# Patient Record
Sex: Male | Born: 1955 | Race: Black or African American | Hispanic: No | Marital: Single | Smoking: Current some day smoker
Health system: Southern US, Community
[De-identification: ages and names within clinical notes are randomized; demographics above are authoritative.]

## PROBLEM LIST (undated history)

## (undated) DIAGNOSIS — E785 Hyperlipidemia, unspecified: Secondary | ICD-10-CM

## (undated) DIAGNOSIS — Z720 Tobacco use: Secondary | ICD-10-CM

## (undated) DIAGNOSIS — S52502A Unspecified fracture of the lower end of left radius, initial encounter for closed fracture: Secondary | ICD-10-CM

## (undated) DIAGNOSIS — S069XAA Unspecified intracranial injury with loss of consciousness status unknown, initial encounter: Secondary | ICD-10-CM

## (undated) DIAGNOSIS — S069X9A Unspecified intracranial injury with loss of consciousness of unspecified duration, initial encounter: Secondary | ICD-10-CM

## (undated) DIAGNOSIS — R7303 Prediabetes: Secondary | ICD-10-CM

## (undated) DIAGNOSIS — I10 Essential (primary) hypertension: Secondary | ICD-10-CM

## (undated) HISTORY — DX: Prediabetes: R73.03

## (undated) HISTORY — DX: Tobacco use: Z72.0

## (undated) HISTORY — DX: Hyperlipidemia, unspecified: E78.5

## (undated) HISTORY — DX: Unspecified intracranial injury with loss of consciousness of unspecified duration, initial encounter: S06.9X9A

## (undated) HISTORY — DX: Unspecified intracranial injury with loss of consciousness status unknown, initial encounter: S06.9XAA

## (undated) HISTORY — PX: NO PAST SURGERIES: SHX2092

---

## 2000-12-24 ENCOUNTER — Encounter: Payer: Self-pay | Admitting: *Deleted

## 2000-12-24 ENCOUNTER — Emergency Department (HOSPITAL_COMMUNITY): Admission: EM | Admit: 2000-12-24 | Discharge: 2000-12-24 | Payer: Self-pay | Admitting: *Deleted

## 2004-04-01 ENCOUNTER — Emergency Department (HOSPITAL_COMMUNITY): Admission: EM | Admit: 2004-04-01 | Discharge: 2004-04-02 | Payer: Self-pay | Admitting: Emergency Medicine

## 2004-05-06 ENCOUNTER — Emergency Department (HOSPITAL_COMMUNITY): Admission: EM | Admit: 2004-05-06 | Discharge: 2004-05-06 | Payer: Self-pay | Admitting: Emergency Medicine

## 2004-05-08 ENCOUNTER — Encounter: Payer: Self-pay | Admitting: Orthopedic Surgery

## 2004-12-24 IMAGING — CR DG KNEE COMPLETE 4+V*L*
4 series · 4 of 4 positions shown · non-contrast
Comparison: none

CLINICAL DATA: Pain.  Twist injury.
 LEFT KNEE FOUR VIEWS
 Non union of anterior tubercle ossification center.  Probable small joint effusion.  No acute fracture, dislocation or bone destruction.  Minimal degenerative changes.
 IMPRESSION
 Joint effusion.  No acute bony abnormalities.

[view not recorded (1 of 4)]
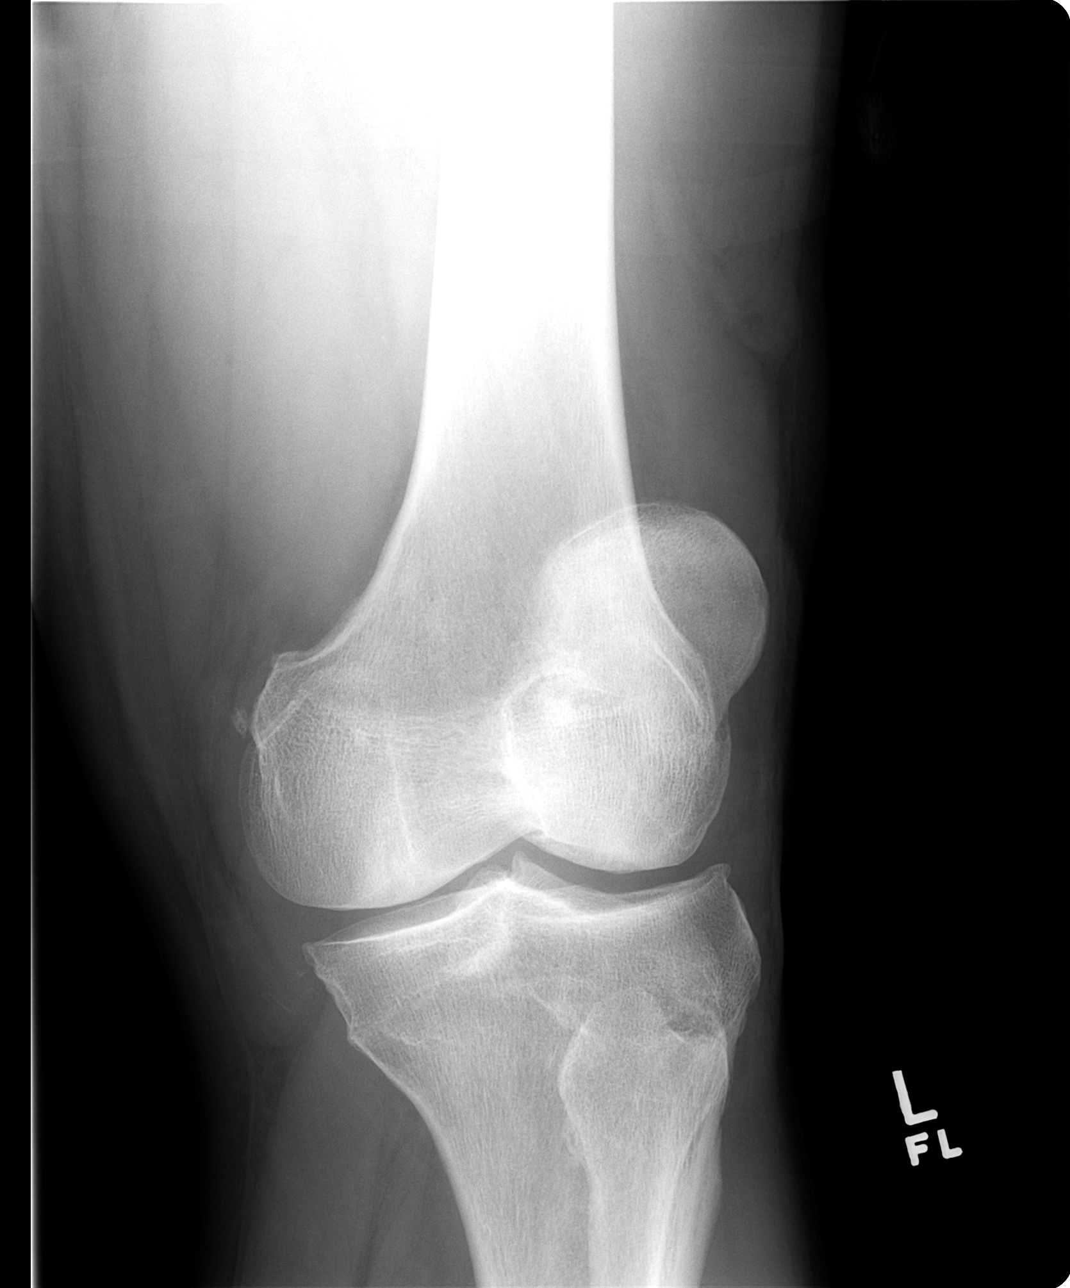

[view not recorded (2 of 4)]
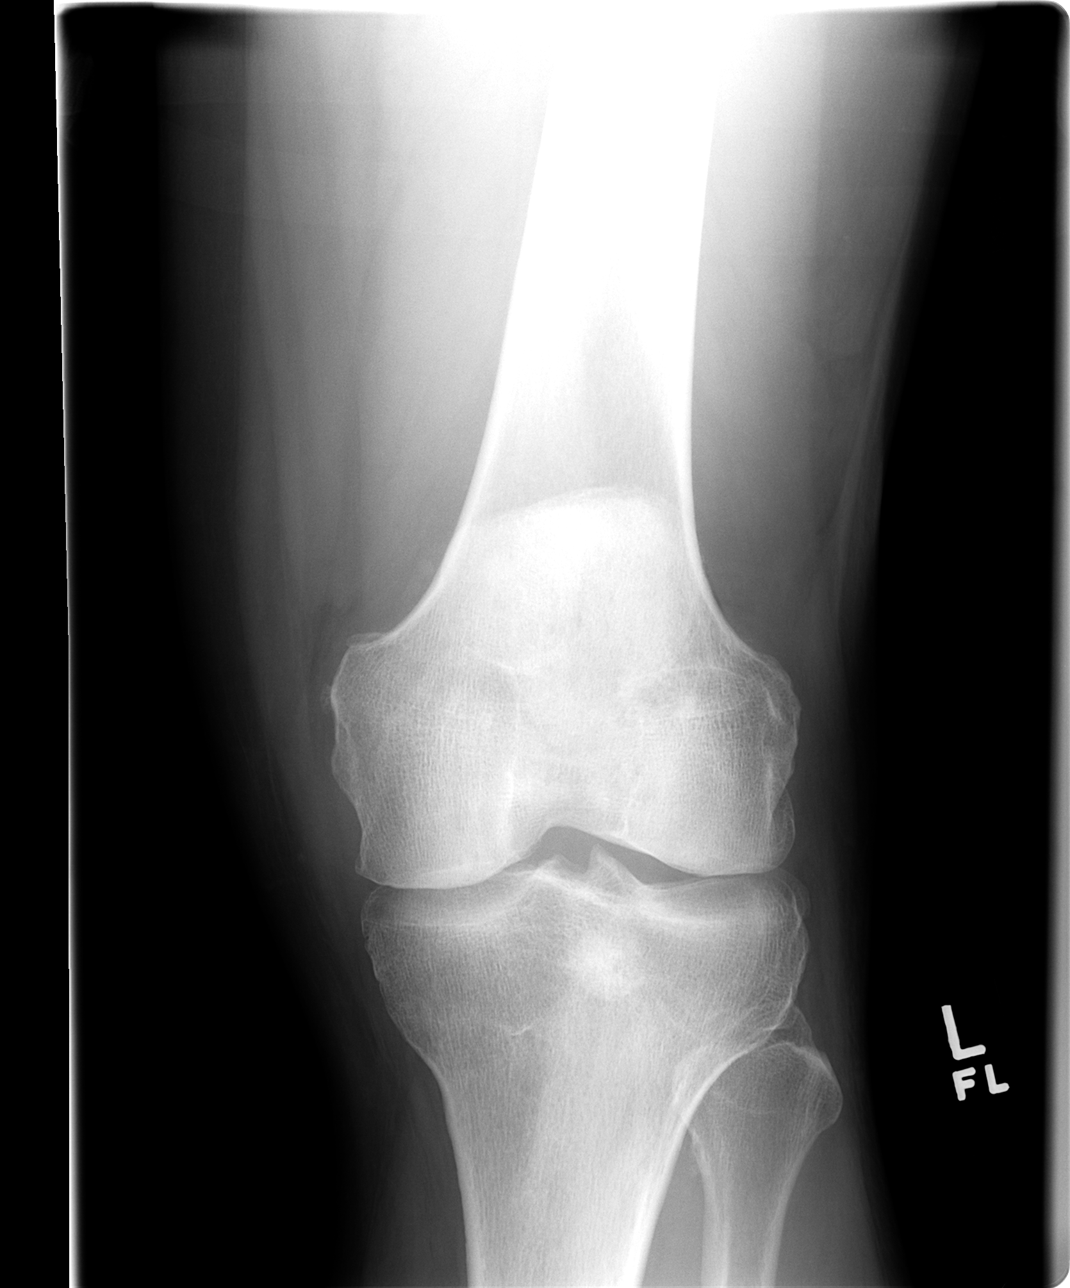

[view not recorded (3 of 4)]
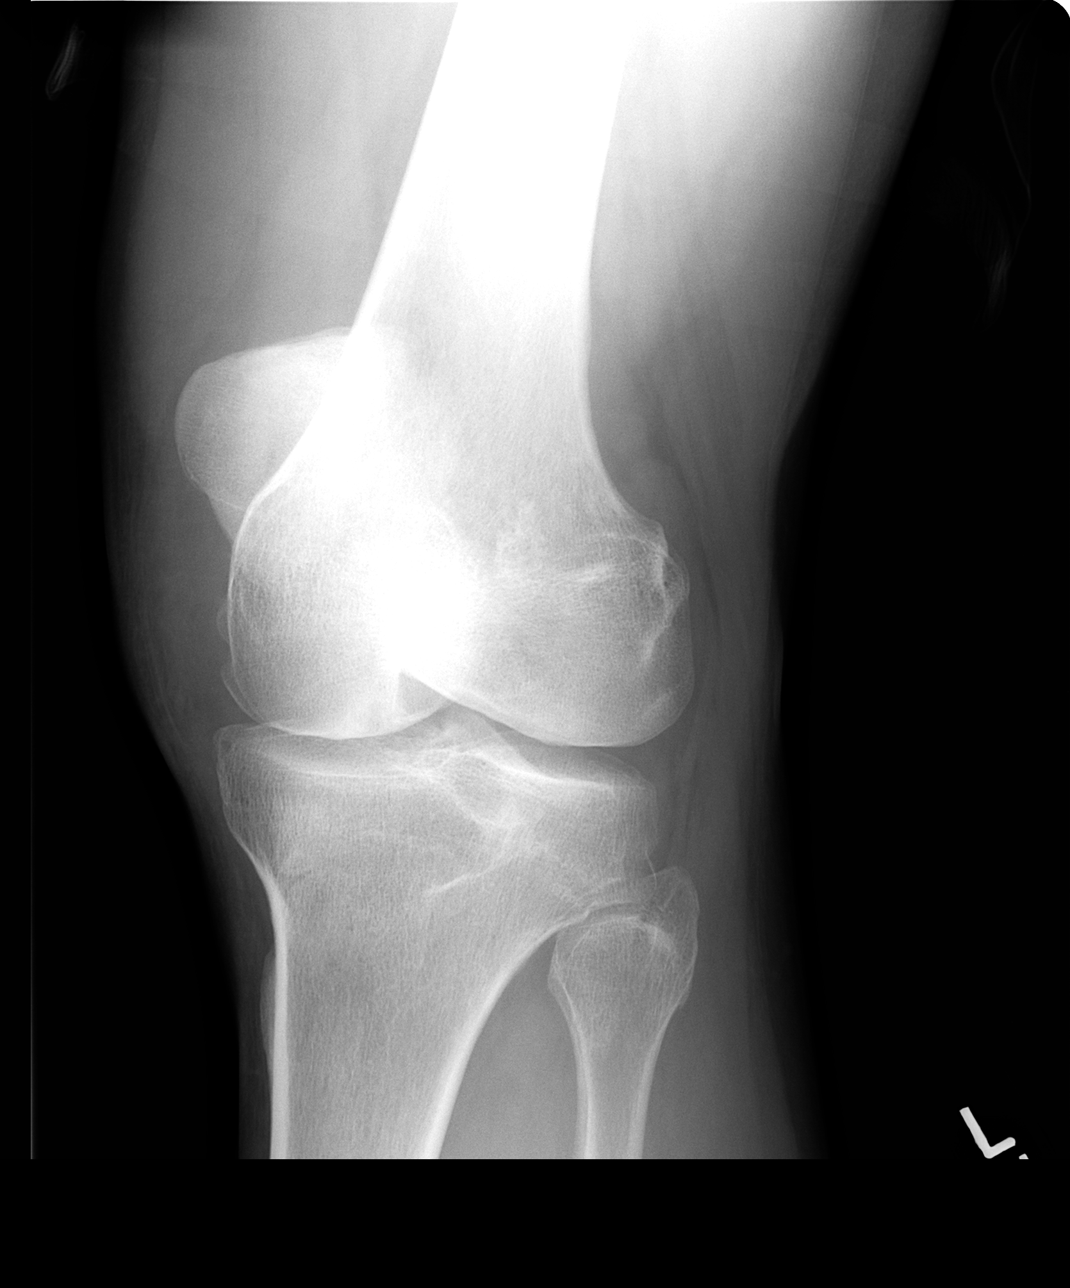

[view not recorded (4 of 4)]
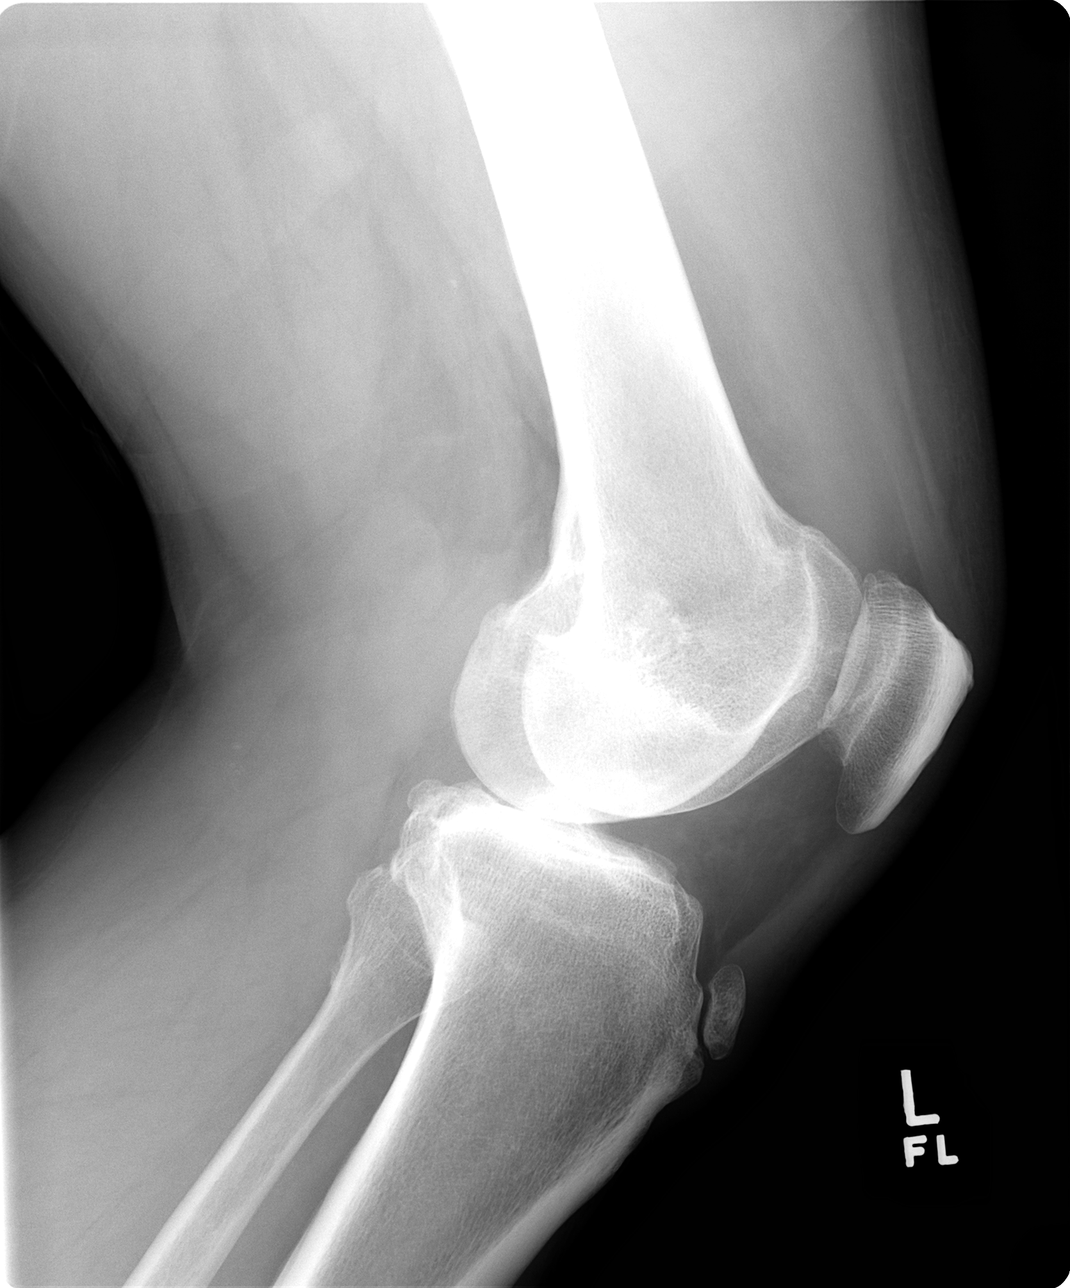

[4 of 4 positions shown; findings below may reference images not displayed]

## 2007-06-08 ENCOUNTER — Emergency Department (HOSPITAL_COMMUNITY): Admission: EM | Admit: 2007-06-08 | Discharge: 2007-06-09 | Payer: Self-pay | Admitting: Emergency Medicine

## 2008-05-18 ENCOUNTER — Encounter: Payer: Self-pay | Admitting: Orthopedic Surgery

## 2008-05-18 ENCOUNTER — Emergency Department (HOSPITAL_COMMUNITY): Admission: EM | Admit: 2008-05-18 | Discharge: 2008-05-18 | Payer: Self-pay | Admitting: Emergency Medicine

## 2008-06-02 ENCOUNTER — Ambulatory Visit: Payer: Self-pay | Admitting: Orthopedic Surgery

## 2008-06-02 DIAGNOSIS — M1710 Unilateral primary osteoarthritis, unspecified knee: Secondary | ICD-10-CM

## 2008-06-02 DIAGNOSIS — IMO0002 Reserved for concepts with insufficient information to code with codable children: Secondary | ICD-10-CM | POA: Insufficient documentation

## 2008-06-02 DIAGNOSIS — M25469 Effusion, unspecified knee: Secondary | ICD-10-CM

## 2013-06-11 ENCOUNTER — Encounter (HOSPITAL_COMMUNITY): Payer: Self-pay | Admitting: Emergency Medicine

## 2013-06-11 DIAGNOSIS — J020 Streptococcal pharyngitis: Secondary | ICD-10-CM | POA: Insufficient documentation

## 2013-06-11 DIAGNOSIS — F17200 Nicotine dependence, unspecified, uncomplicated: Secondary | ICD-10-CM | POA: Insufficient documentation

## 2013-06-11 DIAGNOSIS — R599 Enlarged lymph nodes, unspecified: Secondary | ICD-10-CM | POA: Insufficient documentation

## 2013-06-11 NOTE — ED Notes (Signed)
Patient c/o sore throat and possible sinus infection x2 days.  Patient states has been taking OTC medication without relief.

## 2013-06-12 ENCOUNTER — Emergency Department (HOSPITAL_COMMUNITY)
Admission: EM | Admit: 2013-06-12 | Discharge: 2013-06-12 | Disposition: A | Discharge status: Home / Self Care | Payer: Self-pay | Attending: Emergency Medicine | Admitting: Emergency Medicine

## 2013-06-12 DIAGNOSIS — J020 Streptococcal pharyngitis: Secondary | ICD-10-CM

## 2013-06-12 LAB — RAPID STREP SCREEN (MED CTR MEBANE ONLY): Streptococcus, Group A Screen (Direct): POSITIVE — AB

## 2013-06-12 MED ORDER — PENICILLIN G BENZATHINE 1200000 UNIT/2ML IM SUSP
1.2000 10*6.[IU] | Freq: Once | INTRAMUSCULAR | Status: AC
  Administered 2013-06-12: 1.2 10*6.[IU] via INTRAMUSCULAR
  Filled 2013-06-12: qty 2

## 2013-06-12 MED ORDER — HYDROCODONE-ACETAMINOPHEN 7.5-325 MG/15ML PO SOLN: 10.0000 mL | Freq: Four times a day (QID) | ORAL | Status: DC | PRN

## 2013-06-12 NOTE — ED Notes (Signed)
Pt reporting sore throat.  Also having pressure in sinuses.  Denies fever at this time.

## 2013-06-12 NOTE — ED Provider Notes (Signed)
CSN: [id number]     Arrival date & time 06/11/13  2341 History   First MD Initiated Contact with Patient 06/12/13 0109     Chief Complaint  Patient presents with  . Sore Throat   (Consider location/radiation/quality/duration/timing/severity/associated sxs/prior Treatment) HPI This is a 57 year old male with a two-day history of sore throat. Pain is moderate and worse with swallowing. He is also having anterior cervical lymphadenopathy primarily on the right side. He thought at first he may have a sinus infection but now thinks that what he thought was sinuses was actually the swollen lymph nodes. Is not sure if he had a fever because he is been taking Tylenol. He denies nasal congestion, cough or shortness of breath.  History reviewed. No pertinent past medical history. History reviewed. No pertinent past surgical history. No family history on file. History  Substance Use Topics  . Smoking status: Current Some Day Smoker  . Smokeless tobacco: Not on file  . Alcohol Use: Yes    Review of Systems  All other systems reviewed and are negative.    Allergies  Review of patient's allergies indicates no known allergies.  Home Medications  No current outpatient prescriptions on file. BP 140/75  Pulse 71  Temp(Src) 98.7 F (37.1 C) (Oral)  Resp 18  Ht 5\' 9"  (1.753 m)  Wt 205 lb (92.987 kg)  BMI 30.26 kg/m2  SpO2 97%  Physical Exam General: Well-developed, well-nourished male in no acute distress; appearance consistent with age of record HENT: normocephalic; atraumatic; no sinus tenderness; mild pharyngeal erythema without exudate Eyes: pupils equal, round and reactive to light; extraocular muscles intact; arcus senilis bilaterally Neck: supple; anterior cervical lymphadenopathy right greater than left Heart: regular rate and rhythm Lungs: clear to auscultation bilaterally Abdomen: soft; nondistended Extremities: No deformity Neurologic: Awake, alert and oriented; motor  function intact in all extremities and symmetric; no facial droop Skin: Warm and dry Psychiatric: Normal mood and affect    ED Course  Procedures (including critical care time)    MDM   Nursing notes and vitals signs, including pulse oximetry, reviewed.  Summary of this visit's results, reviewed by myself:  Labs:  Results for orders placed during the hospital encounter of 06/12/13 (from the past 24 hour(s))  RAPID STREP SCREEN     Status: Abnormal   Collection Time    06/12/13  1:15 AM      Result Value Range   Streptococcus, Group A Screen (Direct) POSITIVE (*) NEGATIVE        Hanley Seamen, MD 06/12/13 [phone]

## 2013-08-07 ENCOUNTER — Emergency Department (HOSPITAL_COMMUNITY)
Admission: EM | Admit: 2013-08-07 | Discharge: 2013-08-07 | Disposition: A | Discharge status: Home / Self Care | Payer: Self-pay | Attending: Emergency Medicine | Admitting: Emergency Medicine

## 2013-08-07 ENCOUNTER — Encounter (HOSPITAL_COMMUNITY): Payer: Self-pay | Admitting: Emergency Medicine

## 2013-08-07 ENCOUNTER — Emergency Department (HOSPITAL_COMMUNITY): Payer: Self-pay

## 2013-08-07 DIAGNOSIS — M778 Other enthesopathies, not elsewhere classified: Secondary | ICD-10-CM

## 2013-08-07 DIAGNOSIS — F17200 Nicotine dependence, unspecified, uncomplicated: Secondary | ICD-10-CM | POA: Insufficient documentation

## 2013-08-07 DIAGNOSIS — M6580 Other synovitis and tenosynovitis, unspecified site: Secondary | ICD-10-CM | POA: Insufficient documentation

## 2013-08-07 MED ORDER — HYDROCODONE-ACETAMINOPHEN 5-325 MG PO TABS: 1.0000 | ORAL_TABLET | ORAL | Status: DC | PRN

## 2013-08-07 MED ORDER — IBUPROFEN 600 MG PO TABS: 600.0000 mg | ORAL_TABLET | Freq: Three times a day (TID) | ORAL | Status: DC | PRN

## 2013-08-07 NOTE — ED Notes (Signed)
Pain, swelling, redness of rt elbow for 3 days. No known injury, Increase in pain with movement.

## 2013-08-07 NOTE — ED Notes (Signed)
Pt c/o elbow pain right x 2 days. Denies pain. Moderate amount of swelling and redness noted.

## 2013-08-09 NOTE — ED Provider Notes (Signed)
CSN: [id number]     Arrival date & time 08/07/13  1003 History   First MD Initiated Contact with Patient 08/07/13 1056     Chief Complaint  Patient presents with  . Elbow Pain   (Consider location/radiation/quality/duration/timing/severity/associated sxs/prior Treatment) HPI Comments: MATTTHEW ZIOMEK is a 57 y.o. Male presenting with pain and swelling along his lateral elbow and upper forearm which started 2 days ago. 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  He denies specific injury.  Pain is aching and worsened with movement and palpation.  He has taken tylenol and applied ice without resolution of the symptoms.     The history is provided by the patient.    History reviewed. No pertinent past medical history. History reviewed. No pertinent past surgical history. History reviewed. No pertinent family history. History  Substance Use Topics  . Smoking status: Current Some Day Smoker  . Smokeless tobacco: Not on file  . Alcohol Use: Yes     Comment: occ    Review of Systems  Constitutional: Negative for fever.  Musculoskeletal: Positive for arthralgias and joint swelling. Negative for myalgias.  Skin: Negative for rash and wound.  Neurological: Negative for weakness and numbness.    Allergies  Review of patient's allergies indicates no known allergies.  Home Medications   Current Outpatient Rx  Name  Route  Sig  Dispense  Refill  . acetaminophen (TYLENOL) 325 MG tablet   Oral   Take 650 mg by mouth daily as needed for mild pain or moderate pain.         Marland Kitchen HYDROcodone-acetaminophen (NORCO/VICODIN) 5-325 MG per tablet   Oral   Take 1 tablet by mouth every 4 (four) hours as needed for moderate pain.   20 tablet   0   . ibuprofen (ADVIL,MOTRIN) 600 MG tablet   Oral   Take 1 tablet (600 mg total) by mouth every 8 (eight) hours as needed.   30  tablet   0    BP 134/86  Pulse 75  Temp(Src) 98.1 F (36.7 C) (Oral)  Resp 20  SpO2 98% Physical Exam  Constitutional: He appears well-developed and well-nourished.  HENT:  Head: Atraumatic.  Neck: Normal range of motion.  Cardiovascular:  Pulses equal bilaterally  Musculoskeletal: He exhibits tenderness.       Right elbow: He exhibits decreased range of motion and swelling. He exhibits no effusion. Tenderness found. Lateral epicondyle tenderness noted.  ttp with edema and inflammation noted along lateral epicondyle radiating into mid lateral forearm.  Pain is worsened with extension of wrist and with pronation/supination of forearm.  He can range the elbow joint, but with discomfort. Radial pulse full,  Less than 3 sec cap refill.  Skin intact.  Neurological: He is alert. He has normal strength. He displays normal reflexes. No sensory deficit.  Equal strength  Skin: Skin is warm and dry.  Psychiatric: He has a normal mood and affect.    ED Course  Procedures (including critical care time) Labs Review Labs Reviewed - No data to display Imaging Review Dg Elbow Complete Right  08/07/2013   CLINICAL DATA:  Pain.  EXAM: RIGHT ELBOW - COMPLETE 3+ VIEW  COMPARISON:  No prior.  FINDINGS: Elbow joint effusion cannot be excluded. Mild degenerative spurring present. Small bony densities noted adjacent to the distal humeral radial epicondyle. These could represent avulsion fracture fragments, age  undetermined. These bony densities may be related to an old injury related to the common extensor tendon and/or radial collateral ligament. Radial head and ulna intact.  IMPRESSION: 1. Elbow joint effusion cannot be excluded.  Mild DJD. 2. Small bony densities adjacent to the distal humeral radial epicondyle. These could represent displaced avulsion fracture fragments, age undetermined. These are at the site of the origin of the common extensor tendon and radial collateral ligament and may be related  to an old injury involving these structures. MRI may prove useful for further evaluation.   Electronically Signed   By: Maisie Fus  Register   On: 08/07/2013 11:35    EKG Interpretation   None       MDM   1. Tendonitis of elbow, right    Patients labs and/or radiological studies were viewed and considered during the medical decision making and disposition process. Pt encouraged RICE,  May add heat tx on day 3.  Ace wrap.  F/u with Dr. Romeo Apple if not improving with this tx.  Pt has seen Dr. Romeo Apple prior for knee issues.  The patient appears reasonably screened and/or stabilized for discharge and I doubt any other medical condition or other [facility] requiring further screening, evaluation, or treatment in the ED at this time prior to discharge.   Burgess Amor, PA-C 08/09/13 1027

## 2013-08-12 NOTE — ED Provider Notes (Signed)
Medical screening examination/treatment/procedure(s) were performed by non-physician practitioner and as supervising physician I was immediately available for consultation/collaboration.  EKG Interpretation   None        Alechia Lezama, MD 08/12/13 0733 

## 2014-01-13 ENCOUNTER — Encounter (HOSPITAL_COMMUNITY): Payer: Self-pay | Admitting: Emergency Medicine

## 2014-01-13 ENCOUNTER — Emergency Department (HOSPITAL_COMMUNITY)
Admission: EM | Admit: 2014-01-13 | Discharge: 2014-01-13 | Disposition: A | Discharge status: Home / Self Care | Payer: Self-pay | Attending: Emergency Medicine | Admitting: Emergency Medicine

## 2014-01-13 ENCOUNTER — Emergency Department (HOSPITAL_COMMUNITY): Payer: Self-pay

## 2014-01-13 DIAGNOSIS — IMO0002 Reserved for concepts with insufficient information to code with codable children: Secondary | ICD-10-CM | POA: Insufficient documentation

## 2014-01-13 DIAGNOSIS — F17200 Nicotine dependence, unspecified, uncomplicated: Secondary | ICD-10-CM | POA: Insufficient documentation

## 2014-01-13 DIAGNOSIS — M179 Osteoarthritis of knee, unspecified: Secondary | ICD-10-CM

## 2014-01-13 DIAGNOSIS — M25562 Pain in left knee: Secondary | ICD-10-CM

## 2014-01-13 DIAGNOSIS — Z791 Long term (current) use of non-steroidal anti-inflammatories (NSAID): Secondary | ICD-10-CM | POA: Insufficient documentation

## 2014-01-13 DIAGNOSIS — M1710 Unilateral primary osteoarthritis, unspecified knee: Secondary | ICD-10-CM

## 2014-01-13 MED ORDER — NAPROXEN 250 MG PO TABS
500.0000 mg | ORAL_TABLET | Freq: Once | ORAL | Status: AC
  Administered 2014-01-13: 500 mg via ORAL
  Filled 2014-01-13: qty 2

## 2014-01-13 MED ORDER — NAPROXEN 500 MG PO TABS: 500.0000 mg | ORAL_TABLET | Freq: Two times a day (BID) | ORAL | Status: DC

## 2014-01-13 MED ORDER — HYDROCODONE-ACETAMINOPHEN 5-325 MG PO TABS: ORAL_TABLET | ORAL | Status: DC

## 2014-01-13 NOTE — ED Notes (Signed)
Pt c/o left knee pain since Monday. Pt denies injury.

## 2014-01-13 NOTE — Discharge Instructions (Signed)
Knee Pain Knee pain can be a result of an injury or other medical conditions. Treatment will depend on the cause of your pain. HOME CARE  Only take medicine as told by your doctor.  Keep a healthy weight. Being overweight can make the knee hurt more.  Stretch before exercising or playing sports.  If there is constant knee pain, change the way you exercise. Ask your doctor for advice.  Make sure shoes fit well. Choose the right shoe for the sport or activity.  Protect your knees. Wear kneepads if needed.  Rest when you are tired. GET HELP RIGHT AWAY IF:   Your knee pain does not stop.  Your knee pain does not get better.  Your knee joint feels hot to the touch.  You have a fever. MAKE SURE YOU:   Understand these instructions.  Will watch this condition.  Will get help right away if you are not doing well or get worse. Document Released: 11/23/2008 Document Revised: 11/19/2011 Document Reviewed: 11/23/2008 [facility] Patient Information 2014 [place], [place].  Osteoarthritis Osteoarthritis is a disease that causes soreness and swelling (inflammation) of a joint. It occurs when the cartilage at the affected joint wears down. Cartilage acts as a cushion, covering the ends of bones where they meet to form a joint. Osteoarthritis is the most common form of arthritis. It often occurs in older people. The joints affected most often by this condition include those in the:  Ends of the fingers.  Thumbs.  Neck.  Lower back.  Knees.  Hips. CAUSES  Over time, the cartilage that covers the ends of bones begins to wear away. This causes bone to rub on bone, producing pain and stiffness in the affected joints.  RISK FACTORS Certain factors can increase your chances of having osteoarthritis, including:  Older age.  Excessive body weight.  Overuse of joints. SIGNS AND SYMPTOMS   Pain, swelling, and stiffness in the joint.  Over time, the joint may lose its normal  shape.  Small deposits of bone (osteophytes) may grow on the edges of the joint.  Bits of bone or cartilage can break off and float inside the joint space. This may cause more pain and damage. DIAGNOSIS  Your health care provider will do a physical exam and ask about your symptoms. Various tests may be ordered, such as:  X-rays of the affected joint.  An MRI scan.  Blood tests to rule out other types of arthritis.  Joint fluid tests. This involves using a needle to draw fluid from the joint and examining the fluid under a microscope. TREATMENT  Goals of treatment are to control pain and improve joint function. Treatment plans may include:  A prescribed exercise program that allows for rest and joint relief.  A weight control plan.  Pain relief techniques, such as:  Properly applied heat and cold.  Electric pulses delivered to nerve endings under the skin (transcutaneous electrical nerve stimulation, TENS).  Massage.  Certain nutritional supplements.  Medicines to control pain, such as:  Acetaminophen.  Nonsteroidal anti-inflammatory drugs (NSAIDs), such as naproxen.  Narcotic or central-acting agents, such as tramadol.  Corticosteroids. These can be given orally or as an injection.  Surgery to reposition the bones and relieve pain (osteotomy) or to remove loose pieces of bone and cartilage. Joint replacement may be needed in advanced states of osteoarthritis. HOME CARE INSTRUCTIONS   Only take over-the-counter or prescription medicines as directed by your health care provider. Take all medicines exactly as instructed.  Maintain a healthy weight. Follow your health care provider's instructions for weight control. This may include dietary instructions.  Exercise as directed. Your health care provider can recommend specific types of exercise. These may include:  Strengthening exercises These are done to strengthen the muscles that support joints affected by arthritis.  They can be performed with weights or with exercise bands to add resistance.  Aerobic activities These are exercises, such as brisk walking or low-impact aerobics, that get your heart pumping.  Range-of-motion activities These keep your joints limber.  Balance and agility exercises These help you maintain daily living skills.  Rest your affected joints as directed by your health care provider.  Follow up with your health care provider as directed. SEEK MEDICAL CARE IF:   Your skin turns red.  You develop a rash in addition to your joint pain.  You have worsening joint pain. SEEK IMMEDIATE MEDICAL CARE IF:  You have a significant loss of weight or appetite.  You have a fever along with joint or muscle aches.  You have night sweats. [place] of Arthritis and Musculoskeletal and Skin Diseases: www.niams.SouthExposed.es [employer] on Aging: http://kim-miller.com/ American College of Rheumatology: www.rheumatology.org Document Released: 08/27/2005 Document Revised: 06/17/2013 Document Reviewed: 05/04/2013 [facility] Patient Information 2014 [place], [place].

## 2014-01-13 NOTE — ED Notes (Signed)
Pt alert & oriented x4, stable gait. Patient given discharge instructions, paperwork & prescription(s). Patient  instructed to stop at the registration desk to finish any additional paperwork. Patient verbalized understanding. Pt left department w/ no further questions. 

## 2014-01-15 NOTE — ED Provider Notes (Signed)
CSN: [id number]     Arrival date & time 01/13/14  1738 History   First MD Initiated Contact with Patient 01/13/14 1751     Chief Complaint  Patient presents with  . Knee Pain     (Consider location/radiation/quality/duration/timing/severity/associated sxs/prior Treatment) Patient is a 58 y.o. male presenting with knee pain. The history is provided by the patient.  Knee Pain Location:  Knee Time since incident:  1 day Injury: no   Knee location:  L knee Pain details:    Quality:  Aching and throbbing   Radiates to:  Does not radiate   Severity:  Moderate   Onset quality:  Gradual   Timing:  Constant   Progression:  Unchanged Chronicity:  New Dislocation: no   Foreign body present:  No foreign bodies Prior injury to area:  No Relieved by:  Rest Worsened by:  Activity, bearing weight and flexion Ineffective treatments:  Acetaminophen Associated symptoms: no back pain, no decreased ROM, no fever, no muscle weakness, no neck pain, no numbness, no stiffness, no swelling and no tingling     History reviewed. No pertinent past medical history. History reviewed. No pertinent past surgical history. History reviewed. No pertinent family history. History  Substance Use Topics  . Smoking status: Current Some Day Smoker  . Smokeless tobacco: Not on file  . Alcohol Use: Yes     Comment: occ    Review of Systems  Constitutional: Negative for fever and chills.  Genitourinary: Negative for dysuria and difficulty urinating.  Musculoskeletal: Positive for arthralgias and joint swelling. Negative for back pain, neck pain and stiffness.  Skin: Negative for color change and wound.  Neurological: Negative for weakness and numbness.  All other systems reviewed and are negative.     Allergies  Review of patient's allergies indicates no known allergies.  Home Medications   Prior to Admission medications   Medication Sig Start Date End Date Taking? Authorizing Provider  acetaminophen  (TYLENOL) 325 MG tablet Take 650 mg by mouth daily as needed for mild pain or moderate pain.    Historical Provider, MD  HYDROcodone-acetaminophen (NORCO/VICODIN) 5-325 MG per tablet Take one-two tabs po q 4-6 hrs prn pain 01/13/14   Flonnie Wierman L. Rebakah Cokley, PA-C  naproxen (NAPROSYN) 500 MG tablet Take 1 tablet (500 mg total) by mouth 2 (two) times daily. Take with food 01/13/14   Bridgett Hattabaugh L. Adia Crammer, PA-C   BP 144/82  Pulse 78  Temp(Src) 98.5 F (36.9 C) (Oral)  Resp 18  Ht 5\' 9"  (1.753 m)  Wt 200 lb (90.719 kg)  BMI 29.52 kg/m2  SpO2 98% Physical Exam  Nursing note and vitals reviewed. Constitutional: He is oriented to person, place, and time. He appears well-developed and well-nourished. No distress.  Cardiovascular: Normal rate, regular rhythm, normal heart sounds and intact distal pulses.   Pulmonary/Chest: Effort normal and breath sounds normal. No respiratory distress.  Musculoskeletal: He exhibits tenderness.  ttp of the anterior and lateral left knee.  Moderate patellar crepitus with flexion.  No erythema, effusion, or step-off deformity.  DP pulse brisk, distal sensation intact. Calf is soft and NT. No proximal or distal tenderness  Neurological: He is alert and oriented to person, place, and time. He exhibits normal muscle tone. Coordination normal.  Skin: Skin is warm and dry. No erythema.    ED Course  Procedures (including critical care time) Labs Review Labs Reviewed - No data to display  Imaging Review Dg Knee Complete 4 Views Left  01/13/2014  CLINICAL DATA:  Chronic knee pain increased in severity over past 2 days  EXAM: LEFT KNEE - COMPLETE 4+ VIEW  COMPARISON:  Ninety-two thousand nine  FINDINGS: Osseous mineralization grossly normal.  Advanced osteoarthritic changes of the LEFT knee greatest at medial compartment, progressive since 2009.  Bone-on-bone appearance at medial compartment.  Bulky patellofemoral spurs.  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  No acute  fracture, dislocation or bone destruction.  No knee joint effusion.  IMPRESSION: Advanced osteoarthritic changes LEFT knee progressive since 2009.   Electronically Signed   By: Lavonia Dana M.D.   On: 01/13/2014 18:57     EKG Interpretation None      MDM   Final diagnoses:  Knee pain, left  Osteoarthritis of knee    Pt is ambulatory.  No concerning sx's for septic joint.  Discussed XR findings with the pt.  He agree to symptomatic treatment with vicodin and naprosyn.  Referral to orthopedics given.  VSS.  And pt appears stable for d/c    Shawna Kiener L. Legend Tumminello, PA-C 01/15/14 1246

## 2014-01-18 NOTE — ED Provider Notes (Signed)
Medical screening examination/treatment/procedure(s) were performed by non-physician practitioner and as supervising physician I was immediately available for consultation/collaboration.   EKG Interpretation None        Mervin Kung, MD 01/18/14 [phone]

## 2014-01-29 ENCOUNTER — Encounter (HOSPITAL_COMMUNITY): Payer: Self-pay | Admitting: Emergency Medicine

## 2014-01-29 ENCOUNTER — Emergency Department (HOSPITAL_COMMUNITY)
Admission: EM | Admit: 2014-01-29 | Discharge: 2014-01-29 | Disposition: A | Discharge status: Home / Self Care | Payer: Self-pay | Attending: Emergency Medicine | Admitting: Emergency Medicine

## 2014-01-29 DIAGNOSIS — H6120 Impacted cerumen, unspecified ear: Secondary | ICD-10-CM | POA: Insufficient documentation

## 2014-01-29 DIAGNOSIS — Z79899 Other long term (current) drug therapy: Secondary | ICD-10-CM | POA: Insufficient documentation

## 2014-01-29 DIAGNOSIS — F17200 Nicotine dependence, unspecified, uncomplicated: Secondary | ICD-10-CM | POA: Insufficient documentation

## 2014-01-29 DIAGNOSIS — H6121 Impacted cerumen, right ear: Secondary | ICD-10-CM

## 2014-01-29 MED ORDER — OXYCODONE-ACETAMINOPHEN 5-325 MG PO TABS: 1.0000 | ORAL_TABLET | ORAL | Status: DC | PRN

## 2014-01-29 MED ORDER — NEOMYCIN-POLYMYXIN-HC 3.5-10000-1 OT SUSP: 4.0000 [drp] | Freq: Three times a day (TID) | OTIC | Status: DC

## 2014-01-29 NOTE — ED Provider Notes (Signed)
CSN: [id number]     Arrival date & time 01/29/14  0112 History   First MD Initiated Contact with Patient 01/29/14 0151     Chief Complaint  Patient presents with  . Otalgia    right earache  since monday     (Consider location/radiation/quality/duration/timing/severity/associated sxs/prior Treatment) Patient is a 58 y.o. male presenting with ear pain. The history is provided by the patient.  Otalgia He is complaining of a dull pain in his left ear for the past 3-4 days. Pain is getting worse and he currently rates it at 10/10. It is worse if he touches it and worse if he tries to chew. He has noted some decreased hearing in that ear. There has been no fever, chills, sweats. He tried putting sweet oil in the ear with no benefit.  History reviewed. No pertinent past medical history. History reviewed. No pertinent past surgical history. History reviewed. No pertinent family history. History  Substance Use Topics  . Smoking status: Current Some Day Smoker  . Smokeless tobacco: Not on file  . Alcohol Use: Yes     Comment: occ    Review of Systems  HENT: Positive for ear pain.   All other systems reviewed and are negative.     Allergies  Review of patient's allergies indicates no known allergies.  Home Medications   Prior to Admission medications   Medication Sig Start Date End Date Taking? Authorizing Provider  acetaminophen (TYLENOL) 325 MG tablet Take 650 mg by mouth daily as needed for mild pain or moderate pain.    Historical Provider, MD  HYDROcodone-acetaminophen (NORCO/VICODIN) 5-325 MG per tablet Take one-two tabs po q 4-6 hrs prn pain 01/13/14   Tammy L. Triplett, PA-C  naproxen (NAPROSYN) 500 MG tablet Take 1 tablet (500 mg total) by mouth 2 (two) times daily. Take with food 01/13/14   Tammy L. Triplett, PA-C   BP 169/101  Pulse 60  Temp(Src) 97.7 F (36.5 C) (Oral)  Resp 16  Ht 5\' 9"  (1.753 m)  Wt 200 lb (90.719 kg)  BMI 29.52 kg/m2  SpO2 98% Physical Exam   Nursing note and vitals reviewed.  58 year old male, resting comfortably and in no acute distress. Vital signs are significant for hypertension with blood pressure 169/101. Oxygen saturation is 98%, which is normal. Head is normocephalic and atraumatic. PERRLA, EOMI. Oropharynx is clear. Left tympanic membrane is clear. Right tympanic membrane is obscured by cerumen. There is no swelling around the external canal and there is no pain when tension is applied to the helix. Neck is nontender and supple without adenopathy or JVD. Back is nontender and there is no CVA tenderness. Lungs are clear without rales, wheezes, or rhonchi. Chest is nontender. Heart has regular rate and rhythm without murmur. Abdomen is soft, flat, nontender without masses or hepatosplenomegaly and peristalsis is normoactive. Extremities have no cyanosis or edema, full range of motion is present. Skin is warm and dry without rash. Neurologic: Mental status is normal, cranial nerves are intact, there are no motor or sensory deficits.  ED Course  Procedures (including critical care time)  MDM   Final diagnoses:  Impacted cerumen of right ear    Right otalgia with cerumen impaction. Cerumen will be removed and he will need to be reevaluated. Old records are reviewed and he has no relevant past visits.  Irrigation of the right ear is able to remove a loose stitch of cerumen but the impaction. He started to have some mild  swelling of the external canal which was felt to be predominantly from the trauma. Decision was made to stop attempts to remove impaction tonight and he is sent home with prescription for Cortisporin Otic suspension and also oxycodone and acetaminophen for pain. He is referred to ENT for followup.  Delora Fuel, MD 42/39/53 2023

## 2014-01-29 NOTE — Discharge Instructions (Signed)
Cerumen Impaction A cerumen impaction is when the wax in your ear forms a plug. This plug usually causes reduced hearing. Sometimes it also causes an earache or dizziness. Removing a cerumen impaction can be difficult and painful. The wax sticks to the ear canal. The canal is sensitive and bleeds easily. If you try to remove a heavy wax buildup with a cotton tipped swab, you may push it in further. Irrigation with water, suction, and small ear curettes may be used to clear out the wax. If the impaction is fixed to the skin in the ear canal, ear drops may be needed for a few days to loosen the wax. People who build up a lot of wax frequently can use ear wax removal products available in your local drugstore. SEEK MEDICAL CARE IF:  You develop an earache, increased hearing loss, or marked dizziness. Document Released: 10/04/2004 Document Revised: 11/19/2011 Document Reviewed: 11/24/2009 [employer] Patient Information 2014 [place], [place].  Hydrocortisone; Neomycin; Polymyxin B ear suspension What is this medicine? HYDROCORTISONE; NEOMYCIN; and POLYMYXIN B (hye droe KOR ti sone; nee oh MYE sin; pol i MIX in B) is used to treat ear infections. This medicine may be used for other purposes; ask your health care provider or pharmacist if you have questions. COMMON BRAND NAME(S): AK-Spore HC , AK-Spore HC Otic, Antibiotic Otic, Aural , Cortisporin, Cortomycin , Duomycin-HC, Oti-Sone, Oticin HC, Otimar, Pediotic, Uad  What should I tell my health care provider before I take this medicine? They need to know if you have any of these conditions: -any other active infections -chronic ear infections or fluid in the ear -perforated ear drum -an unusual or allergic reaction to hydrocortisone, neomycin, polymyxin B, sulfites, other medicines, foods, dyes, or preservatives -pregnant or trying to get pregnant -breast-feeding How should I use this medicine? This medicine is only for use in the ears. Wash your hands  with soap and water. Clean your ear of any fluid that can be easily removed. Do not insert any object or swab into the ear canal. Gently warm the bottle by holding it in the hand for 1 to 2 minutes. Lie down on your side with the infected ear up. Try not to touch the tip of the dropper to your ear, fingertips, or other surface. Shake the bottle immediately before using. Squeeze the bottle gently to put the prescribed number of drops in the ear canal. Stay in this position for 30 to 60 seconds to help the drops soak into the ear. Repeat the steps for the other ear if both ears are infected. Do not use your medicine more often than directed. Finish the full course of medicine prescribed by your doctor or health care professional even if you think your condition is better. Talk to your pediatrician regarding the use of this medicine in children. While this drug may be prescribed for selected conditions, precautions do apply. Overdosage: If you think you have taken too much of this medicine contact a poison control center or emergency room at once. NOTE: This medicine is only for you. Do not share this medicine with others. What if I miss a dose? If you miss a dose, use it as soon as you can. If it is almost time for your next dose, use only that dose. Do not take double or extra doses. What may interact with this medicine? Interactions are not expected. Do not use other ear products without talking to your doctor or health care professional. This list may not describe  all possible interactions. Give your health care provider a list of all the medicines, herbs, non-prescription drugs, or dietary supplements you use. Also tell them if you smoke, drink alcohol, or use illegal drugs. Some items may interact with your medicine. What should I watch for while using this medicine? Tell your doctor or health care professional if your ear infection does not get better in a few days. Do not use longer than 10 days  unless instructed by your doctor or health care professional. If rash or allergic reaction occurs, stop the product immediately and contact your physician. It is important that you keep the infected ear(s) clean and dry. When bathing, try not to get the infected ear(s) wet. Do not go swimming unless your doctor or health care professional has told you otherwise. To prevent the spread of infection, do not share ear products, or share towels and washcloths with anyone else. What side effects may I notice from receiving this medicine? Side effects that you should report to your doctor or health care professional as soon as possible: -rash -red, itchy, dry scaly skin at the affected site -worsening ear pain Side effects that usually do not require medical attention (report to your doctor or health care professional if they continue or are bothersome): -abnormal sensation in the ear -burning or stinging while putting the drops in the ear This list may not describe all possible side effects. Call your doctor for medical advice about side effects. You may report side effects to FDA at 1-800-FDA-1088. Where should I keep my medicine? Keep out of the reach of children. Store at room temperature between 15 and 25 degrees C (59 and 77 degrees F). Do not freeze. Throw away any unused medicine after the expiration date. NOTE: This sheet is a summary. It may not cover all possible information. If you have questions about this medicine, talk to your doctor, pharmacist, or health care provider.  2014, Elsevier/Gold Standard. (2008-01-09 15:49:00)  Acetaminophen; Oxycodone tablets What is this medicine? ACETAMINOPHEN; OXYCODONE (a set a MEE noe fen; ox i KOE done) is a pain reliever. It is used to treat mild to moderate pain. This medicine may be used for other purposes; ask your health care provider or pharmacist if you have questions. COMMON BRAND NAME(S): Endocet, Magnacet, Narvox, Percocet, Perloxx,  Primalev, Primlev, Roxicet, Xolox What should I tell my health care provider before I take this medicine? They need to know if you have any of these conditions: -brain tumor -Crohn's disease, inflammatory bowel disease, or ulcerative colitis -drug abuse or addiction -head injury -heart or circulation problems -if you often drink alcohol -kidney disease or problems going to the bathroom -liver disease -lung disease, asthma, or breathing problems -an unusual or allergic reaction to acetaminophen, oxycodone, other opioid analgesics, other medicines, foods, dyes, or preservatives -pregnant or trying to get pregnant -breast-feeding How should I use this medicine? Take this medicine by mouth with a full glass of water. Follow the directions on the prescription label. Take your medicine at regular intervals. Do not take your medicine more often than directed. Talk to your pediatrician regarding the use of this medicine in children. Special care may be needed. Patients over 40 years old may have a stronger reaction and need a smaller dose. Overdosage: If you think you have taken too much of this medicine contact a poison control center or emergency room at once. NOTE: This medicine is only for you. Do not share this medicine with others. What if I  miss a dose? If you miss a dose, take it as soon as you can. If it is almost time for your next dose, take only that dose. Do not take double or extra doses. What may interact with this medicine? -alcohol -antihistamines -barbiturates like amobarbital, butalbital, butabarbital, methohexital, pentobarbital, phenobarbital, thiopental, and secobarbital -benztropine -drugs for bladder problems like solifenacin, trospium, oxybutynin, tolterodine, hyoscyamine, and methscopolamine -drugs for breathing problems like ipratropium and tiotropium -drugs for certain stomach or intestine problems like propantheline, homatropine methylbromide, glycopyrrolate,  atropine, belladonna, and dicyclomine -general anesthetics like etomidate, ketamine, nitrous oxide, propofol, desflurane, enflurane, halothane, isoflurane, and sevoflurane -medicines for depression, anxiety, or psychotic disturbances -medicines for sleep -muscle relaxants -naltrexone -narcotic medicines (opiates) for pain -phenothiazines like perphenazine, thioridazine, chlorpromazine, mesoridazine, fluphenazine, prochlorperazine, promazine, and trifluoperazine -scopolamine -tramadol -trihexyphenidyl This list may not describe all possible interactions. Give your health care provider a list of all the medicines, herbs, non-prescription drugs, or dietary supplements you use. Also tell them if you smoke, drink alcohol, or use illegal drugs. Some items may interact with your medicine. What should I watch for while using this medicine? Tell your doctor or health care professional if your pain does not go away, if it gets worse, or if you have new or a different type of pain. You may develop tolerance to the medicine. Tolerance means that you will need a higher dose of the medication for pain relief. Tolerance is normal and is expected if you take this medicine for a long time. Do not suddenly stop taking your medicine because you may develop a severe reaction. Your body becomes used to the medicine. This does NOT mean you are addicted. Addiction is a behavior related to getting and using a drug for a non-medical reason. If you have pain, you have a medical reason to take pain medicine. Your doctor will tell you how much medicine to take. If your doctor wants you to stop the medicine, the dose will be slowly lowered over time to avoid any side effects. You may get drowsy or dizzy. Do not drive, use machinery, or do anything that needs mental alertness until you know how this medicine affects you. Do not stand or sit up quickly, especially if you are an older patient. This reduces the risk of dizzy or  fainting spells. Alcohol may interfere with the effect of this medicine. Avoid alcoholic drinks. There are different types of narcotic medicines (opiates) for pain. If you take more than one type at the same time, you may have more side effects. Give your health care provider a list of all medicines you use. Your doctor will tell you how much medicine to take. Do not take more medicine than directed. Call emergency for help if you have problems breathing. The medicine will cause constipation. Try to have a bowel movement at least every 2 to 3 days. If you do not have a bowel movement for 3 days, call your doctor or health care professional. Do not take Tylenol (acetaminophen) or medicines that have acetaminophen with this medicine. Too much acetaminophen can be very dangerous. Many nonprescription medicines contain acetaminophen. Always read the labels carefully to avoid taking more acetaminophen. What side effects may I notice from receiving this medicine? Side effects that you should report to your doctor or health care professional as soon as possible: -allergic reactions like skin rash, itching or hives, swelling of the face, lips, or tongue -breathing difficulties, wheezing -confusion -light headedness or fainting spells -severe stomach pain -unusually  weak or tired -yellowing of the skin or the whites of the eyes  Side effects that usually do not require medical attention (report to your doctor or health care professional if they continue or are bothersome): -dizziness -drowsiness -nausea -vomiting This list may not describe all possible side effects. Call your doctor for medical advice about side effects. You may report side effects to FDA at 1-800-FDA-1088. Where should I keep my medicine? Keep out of the reach of children. This medicine can be abused. Keep your medicine in a safe place to protect it from theft. Do not share this medicine with anyone. Selling or giving away this medicine  is dangerous and against the law. Store at room temperature between 20 and 25 degrees C (68 and 77 degrees F). Keep container tightly closed. Protect from light. This medicine may cause accidental overdose and death if it is taken by other adults, children, or pets. Flush any unused medicine down the toilet to reduce the chance of harm. Do not use the medicine after the expiration date. NOTE: This sheet is a summary. It may not cover all possible information. If you have questions about this medicine, talk to your doctor, pharmacist, or health care provider.  2014, Elsevier/Gold Standard. (2013-04-20 13:17:35)

## 2014-02-03 MED FILL — Oxycodone w/ Acetaminophen Tab 5-325 MG: ORAL | Qty: 6 | Status: AC

## 2014-09-23 ENCOUNTER — Ambulatory Visit (INDEPENDENT_AMBULATORY_CARE_PROVIDER_SITE_OTHER): Payer: Self-pay | Admitting: Cardiology

## 2014-09-23 ENCOUNTER — Encounter: Payer: Self-pay | Admitting: *Deleted

## 2014-09-23 ENCOUNTER — Encounter: Payer: Self-pay | Admitting: Cardiology

## 2014-09-23 VITALS — BP 124/75 | HR 67 | Ht 68.0 in | Wt 234.0 lb

## 2014-09-23 DIAGNOSIS — R079 Chest pain, unspecified: Secondary | ICD-10-CM

## 2014-09-23 DIAGNOSIS — R0789 Other chest pain: Secondary | ICD-10-CM

## 2014-09-23 DIAGNOSIS — Z136 Encounter for screening for cardiovascular disorders: Secondary | ICD-10-CM

## 2014-09-23 MED ORDER — ASPIRIN EC 81 MG PO TBEC: 81.0000 mg | DELAYED_RELEASE_TABLET | Freq: Every day | ORAL | Status: DC

## 2014-09-23 NOTE — Progress Notes (Addendum)
       Clinical Summary Mark Walter is a 59 y.o.male seen today as a new patient for the following medical problems.   1. Chest pain - started approx 2 weeks ago. Occurred while getting ready to sit in his car. Sharp pain left chest, 9/10. Lasted approx 30 minutes. No other symptoms. No repeat episodes.  - no DOE, though limited due to chronic knee pain. No LE edema  CAD risk factors: tobacco, age, sister MI 24. Elevated cholesterol, pre-diabetes.  bs  PMH 1. Pre-diabetes 2. Hyperlipidemia 3. Tobacco abuse   No Known Allergies   Current Outpatient Prescriptions  Medication Sig Dispense Refill  . aspirin EC 81 MG tablet Take 1 tablet (81 mg total) by mouth daily.     No current facility-administered medications for this visit.     No past surgical history on file.   No Known Allergies    No family history on file.   Social History [name] reports that he has been smoking Cigarettes.  He started smoking about 40 years ago. He has a 9.75 pack-year smoking history. He has never used smokeless tobacco. [name] reports that he drinks alcohol.   Review of Systems CONSTITUTIONAL: No weight loss, fever, chills, weakness or fatigue.  HEENT: Eyes: No visual loss, blurred vision, double vision or yellow sclerae.No hearing loss, sneezing, congestion, runny nose or sore throat.  SKIN: No rash or itching.  CARDIOVASCULAR: per HPI RESPIRATORY: No shortness of breath, cough or sputum.  GASTROINTESTINAL: No anorexia, nausea, vomiting or diarrhea. No abdominal pain or blood.  GENITOURINARY: No burning on urination, no polyuria NEUROLOGICAL: No headache, dizziness, syncope, paralysis, ataxia, numbness or tingling in the extremities. No change in bowel or bladder control.  MUSCULOSKELETAL: No muscle, back pain, joint pain or stiffness.  LYMPHATICS: No enlarged nodes. No history of splenectomy.  PSYCHIATRIC: No history of depression or anxiety.  ENDOCRINOLOGIC: No reports of  sweating, cold or heat intolerance. No polyuria or polydipsia.  Marland Kitchen   Physical Examination Filed Vitals:   09/23/14 1328  BP: 124/75  Pulse: 67   Filed Weights   09/23/14 1328  Weight: 234 lb (106.142 kg)    Gen: resting comfortably, no acute distress HEENT: no scleral icterus, pupils equal round and reactive, no palptable cervical adenopathy,  CV: RRR, no m/r/g, no JVD Resp: Clear to auscultation bilaterally GI: abdomen is soft, non-tender, non-distended, normal bowel sounds, no hepatosplenomegaly MSK: extremities are warm, no edema.  Skin: warm, no rash Neuro:  no focal deficits Psych: appropriate affect    Assessment and Plan  1. Chest pain - unclear etiology, multiple CAD risk factors as described above - patient unable to exercise due to chronic knee pain, will arrange for Lexiscan MPI - start ASA 81mg  daily  F/u 4 weeks   Arnoldo Lenis, M.D.,

## 2014-09-23 NOTE — Patient Instructions (Addendum)
Your physician has requested that you have a lexiscan myoview. For further information please visit [email or web address]. Please follow instruction sheet, as given. Office will contact with results via phone or letter.   Begin Aspirin 81mg  daily Follow up in  4 weeks

## 2014-10-04 ENCOUNTER — Ambulatory Visit (HOSPITAL_COMMUNITY)
Admission: RE | Admit: 2014-10-04 | Discharge: 2014-10-04 | Disposition: A | Discharge status: Home / Self Care | Payer: Self-pay | Source: Ambulatory Visit | Attending: Cardiology | Admitting: Cardiology

## 2014-10-04 ENCOUNTER — Encounter (HOSPITAL_COMMUNITY): Payer: Self-pay

## 2014-10-04 ENCOUNTER — Encounter (HOSPITAL_COMMUNITY)
Admission: RE | Admit: 2014-10-04 | Discharge: 2014-10-04 | Disposition: A | Discharge status: Home / Self Care | Payer: Self-pay | Source: Ambulatory Visit | Attending: Cardiology | Admitting: Cardiology

## 2014-10-04 ENCOUNTER — Ambulatory Visit (HOSPITAL_COMMUNITY): Payer: Self-pay

## 2014-10-04 DIAGNOSIS — R7309 Other abnormal glucose: Secondary | ICD-10-CM | POA: Insufficient documentation

## 2014-10-04 DIAGNOSIS — E785 Hyperlipidemia, unspecified: Secondary | ICD-10-CM | POA: Insufficient documentation

## 2014-10-04 DIAGNOSIS — R079 Chest pain, unspecified: Secondary | ICD-10-CM | POA: Insufficient documentation

## 2014-10-04 DIAGNOSIS — R072 Precordial pain: Secondary | ICD-10-CM | POA: Insufficient documentation

## 2014-10-04 DIAGNOSIS — Z8249 Family history of ischemic heart disease and other diseases of the circulatory system: Secondary | ICD-10-CM | POA: Insufficient documentation

## 2014-10-04 DIAGNOSIS — Z720 Tobacco use: Secondary | ICD-10-CM | POA: Insufficient documentation

## 2014-10-04 MED ORDER — SODIUM CHLORIDE 0.9 % IJ SOLN
INTRAMUSCULAR | Status: AC
  Administered 2014-10-04: 11:00:00 10 mL via INTRAVENOUS
  Filled 2014-10-04: qty 3

## 2014-10-04 MED ORDER — TECHNETIUM TC 99M SESTAMIBI GENERIC - CARDIOLITE
10.0000 | Freq: Once | INTRAVENOUS | Status: AC | PRN
  Administered 2014-10-04: 10 via INTRAVENOUS

## 2014-10-04 MED ORDER — REGADENOSON 0.4 MG/5ML IV SOLN
INTRAVENOUS | Status: AC
Start: 2014-10-04 — End: 2014-10-04
  Administered 2014-10-04: 11:00:00 0.4 mg via INTRAVENOUS
  Filled 2014-10-04: qty 5

## 2014-10-04 MED ORDER — TECHNETIUM TC 99M SESTAMIBI - CARDIOLITE
30.0000 | Freq: Once | INTRAVENOUS | Status: AC | PRN
  Administered 2014-10-04: 12:00:00 30 via INTRAVENOUS

## 2014-10-04 MED ORDER — SODIUM CHLORIDE 0.9 % IJ SOLN
10.0000 mL | INTRAMUSCULAR | Status: DC | PRN
  Administered 2014-10-04: 10 mL via INTRAVENOUS
  Filled 2014-10-04: qty 10

## 2014-10-04 MED ORDER — REGADENOSON 0.4 MG/5ML IV SOLN
0.4000 mg | Freq: Once | INTRAVENOUS | Status: AC | PRN
  Administered 2014-10-04: 0.4 mg via INTRAVENOUS

## 2014-10-04 NOTE — Progress Notes (Signed)
Stress Lab Nurses Notes - Mark Walter 10/04/2014 Reason for doing test: Chest Pain Type of test: Wille Glaser Nurse performing test: Gerrit Halls, RN Nuclear Medicine Tech: Redmond Baseman Echo Tech: Not Applicable MD performing test: Koneswaran/K.Purcell Nails NP Family MD: [facility] Test explained and consent signed: Yes.   IV started: Saline lock flushed, No redness or edema and Saline lock started in radiology Symptoms: Stomach discomfort Treatment/Intervention: None Reason test stopped: protocol completed After recovery IV was: Discontinued via X-ray tech and No redness or edema Patient to return to Nuc. Med at : 11:15 Patient discharged: Home Patient's Condition upon discharge was: stable Comments: During test BP 132/72 & HR 96.  Recovery BP 129/86 & HR 64.  Symptoms resolved in recovery. Geanie Cooley T

## 2014-10-06 ENCOUNTER — Telehealth: Payer: Self-pay | Admitting: *Deleted

## 2014-10-06 NOTE — Telephone Encounter (Signed)
-----   Message from Arnoldo Lenis, MD sent at 10/05/2014 10:32 AM EST ----- Stress test is normal, his heart looks to be in good shape. Will discuss in more detail at his f/u appt in the next few weeks.  Zandra Abts MD

## 2014-10-06 NOTE — Telephone Encounter (Signed)
Pt made aware, forwarded to Dr. Roslynn Amble

## 2014-10-21 ENCOUNTER — Encounter: Payer: Self-pay | Admitting: *Deleted

## 2014-10-22 ENCOUNTER — Encounter: Payer: Self-pay | Admitting: Cardiology

## 2014-10-22 ENCOUNTER — Ambulatory Visit: Payer: Self-pay | Admitting: Cardiology

## 2014-10-22 NOTE — Progress Notes (Unsigned)
Clinical Summary Mark Walter is a 59 y.o.male seen today for follow up of the following medical problems.   1. Chest pain - started approx 2 weeks ago. Occurred while getting ready to sit in his car. Sharp pain left chest, 9/10. Lasted approx 30 minutes. No other symptoms. No repeat episodes.  - no DOE, though limited due to chronic knee pain. No LE edema  CAD risk factors: tobacco, age, sister MI 104. Elevated cholesterol, pre-diabetes.  bs   - since last visit completed Lexiscan MPI Jan 2016, no evidence of ischemia, LVEF 64%   PMH 1. Pre-diabetes 2. Hyperlipidemia 3. Tobacco abuse    Past Medical History  Diagnosis Date  . Hyperlipemia   . Tobacco abuse   . Pre-diabetes      No Known Allergies   Current Outpatient Prescriptions  Medication Sig Dispense Refill  . aspirin EC 81 MG tablet Take 1 tablet (81 mg total) by mouth daily.     No current facility-administered medications for this visit.     No past surgical history on file.   No Known Allergies    No family history on file.   Social History [name] reports that he has been smoking Cigarettes.  He started smoking about 40 years ago. He has a 9.75 pack-year smoking history. He has never used smokeless tobacco. [name] reports that he drinks alcohol.   Review of Systems CONSTITUTIONAL: No weight loss, fever, chills, weakness or fatigue.  HEENT: Eyes: No visual loss, blurred vision, double vision or yellow sclerae.No hearing loss, sneezing, congestion, runny nose or sore throat.  SKIN: No rash or itching.  CARDIOVASCULAR:  RESPIRATORY: No shortness of breath, cough or sputum.  GASTROINTESTINAL: No anorexia, nausea, vomiting or diarrhea. No abdominal pain or blood.  GENITOURINARY: No burning on urination, no polyuria NEUROLOGICAL: No headache, dizziness, syncope, paralysis, ataxia, numbness or tingling in the extremities. No change in bowel or bladder control.  MUSCULOSKELETAL: No  muscle, back pain, joint pain or stiffness.  LYMPHATICS: No enlarged nodes. No history of splenectomy.  PSYCHIATRIC: No history of depression or anxiety.  ENDOCRINOLOGIC: No reports of sweating, cold or heat intolerance. No polyuria or polydipsia.  Marland Kitchen   Physical Examination There were no vitals filed for this visit. There were no vitals filed for this visit.  Gen: resting comfortably, no acute distress HEENT: no scleral icterus, pupils equal round and reactive, no palptable cervical adenopathy,  CV Resp: Clear to auscultation bilaterally GI: abdomen is soft, non-tender, non-distended, normal bowel sounds, no hepatosplenomegaly MSK: extremities are warm, no edema.  Skin: warm, no rash Neuro:  no focal deficits Psych: appropriate affect   Diagnostic Studies Jan 2016 Lexiscan MPI FINDINGS: Stress/ECG findings: The patient was stressed according to the [facility] protocol. The heart rate ranged from 55 up to 96 beats per min. The blood pressure averaged 133/87. No chest pain was reported.  Baseline ECG demonstrated sinus rhythm. With stress, there were no ischemic ST segment or T-wave abnormalities nor any arrhythmias.  Perfusion: No decreased activity in the left ventricle on stress imaging to suggest reversible ischemia or infarction.  Wall Motion: Normal left ventricular wall motion. No left ventricular dilation.  Left Ventricular Ejection Fraction: 64 %  End diastolic volume 92 ml  End systolic volume 33 ml  IMPRESSION: 1. No reversible ischemia or infarction.  2. Normal left ventricular wall motion.  3. Left ventricular ejection fraction 64%  4. Low-risk stress test findings*.    Assessment and  Plan  1. Chest pain - unclear etiology, multiple CAD risk factors as described above - patient unable to exercise due to chronic knee pain, will arrange for Lexiscan MPI - start ASA 81mg  daily  F/u 4 weeks      Arnoldo Lenis, M.D.

## 2014-11-10 ENCOUNTER — Ambulatory Visit: Payer: Self-pay | Admitting: Cardiology

## 2014-11-10 NOTE — Progress Notes (Unsigned)
Patient is no show

## 2014-12-03 ENCOUNTER — Ambulatory Visit: Payer: Self-pay | Admitting: Cardiology

## 2014-12-03 NOTE — Progress Notes (Unsigned)
Clinical Summary Mark Walter is a 59 y.o.male seen today for follow up of the following medical problems.  1. Chest pain - started approx 2 weeks ago. Occurred while getting ready to sit in his car. Sharp pain left chest, 9/10. Lasted approx 30 minutes. No other symptoms. No repeat episodes.  - no DOE, though limited due to chronic knee pain. No LE edema  CAD risk factors: tobacco, age, sister MI 28. Elevated cholesterol, pre-diabetes.  bs   - since last visit completed Lexiscan MPI Jan 2016, no evidence of ischemia, LVEF 64% Past Medical History  Diagnosis Date  . Hyperlipemia   . Tobacco abuse   . Pre-diabetes      No Known Allergies   Current Outpatient Prescriptions  Medication Sig Dispense Refill  . aspirin EC 81 MG tablet Take 1 tablet (81 mg total) by mouth daily.     No current facility-administered medications for this visit.     No past surgical history on file.   No Known Allergies    No family history on file.   Social History [name] reports that he has been smoking Cigarettes.  He started smoking about 40 years ago. He has a 9.75 pack-year smoking history. He has never used smokeless tobacco. Mark Walter reports that he drinks alcohol.   Review of Systems CONSTITUTIONAL: No weight loss, fever, chills, weakness or fatigue.  HEENT: Eyes: No visual loss, blurred vision, double vision or yellow sclerae.No hearing loss, sneezing, congestion, runny nose or sore throat.  SKIN: No rash or itching.  CARDIOVASCULAR:  RESPIRATORY: No shortness of breath, cough or sputum.  GASTROINTESTINAL: No anorexia, nausea, vomiting or diarrhea. No abdominal pain or blood.  GENITOURINARY: No burning on urination, no polyuria NEUROLOGICAL: No headache, dizziness, syncope, paralysis, ataxia, numbness or tingling in the extremities. No change in bowel or bladder control.  MUSCULOSKELETAL: No muscle, back pain, joint pain or stiffness.  LYMPHATICS: No enlarged  nodes. No history of splenectomy.  PSYCHIATRIC: No history of depression or anxiety.  ENDOCRINOLOGIC: No reports of sweating, cold or heat intolerance. No polyuria or polydipsia.  Marland Kitchen   Physical Examination There were no vitals filed for this visit. There were no vitals filed for this visit.  Gen: resting comfortably, no acute distress HEENT: no scleral icterus, pupils equal round and reactive, no palptable cervical adenopathy,  CV Resp: Clear to auscultation bilaterally GI: abdomen is soft, non-tender, non-distended, normal bowel sounds, no hepatosplenomegaly MSK: extremities are warm, no edema.  Skin: warm, no rash Neuro:  no focal deficits Psych: appropriate affect   Diagnostic Studies Jan 2016 Lexiscan MPI FINDINGS: Stress/ECG findings: The patient was stressed according to the [facility] protocol. The heart rate ranged from 55 up to 96 beats per min. The blood pressure averaged 133/87. No chest pain was reported.  Baseline ECG demonstrated sinus rhythm. With stress, there were no ischemic ST segment or T-wave abnormalities nor any arrhythmias.  Perfusion: No decreased activity in the left ventricle on stress imaging to suggest reversible ischemia or infarction.  Wall Motion: Normal left ventricular wall motion. No left ventricular dilation.  Left Ventricular Ejection Fraction: 64 %  End diastolic volume 92 ml  End systolic volume 33 ml  IMPRESSION: 1. No reversible ischemia or infarction.  2. Normal left ventricular wall motion.  3. Left ventricular ejection fraction 64%  4. Low-risk stress test findings*.     Assessment and Plan   1. Chest pain - unclear etiology, multiple CAD risk factors  as described above - patient unable to exercise due to chronic knee pain, will arrange for Lexiscan MPI - start ASA 81mg  daily     Mark Walter, M.D.

## 2014-12-23 ENCOUNTER — Ambulatory Visit: Payer: Self-pay | Admitting: Cardiology

## 2014-12-23 NOTE — Progress Notes (Unsigned)
Clinical Summary Mark Walter is a 59 y.o.male  1. Chest pain - started approx 2 weeks ago. Occurred while getting ready to sit in his car. Sharp pain left chest, 9/10. Lasted approx 30 minutes. No other symptoms. No repeat episodes.  - no DOE, though limited due to chronic knee pain. No LE edema  CAD risk factors: tobacco, age, sister MI 53. Elevated cholesterol, pre-diabetes.  bs  PMH 1. Pre-diabetes 2. Hyperlipidemia 3. Tobacco abuse Past Medical History  Diagnosis Date  . Hyperlipemia   . Tobacco abuse   . Pre-diabetes      No Known Allergies   Current Outpatient Prescriptions  Medication Sig Dispense Refill  . aspirin EC 81 MG tablet Take 1 tablet (81 mg total) by mouth daily.     No current facility-administered medications for this visit.     No past surgical history on file.   No Known Allergies    No family history on file.   Social History Mark Walter reports that he has been smoking Cigarettes.  He started smoking about 40 years ago. He has a 9.75 pack-year smoking history. He has never used smokeless tobacco. Mark Walter reports that he drinks alcohol.   Review of Systems CONSTITUTIONAL: No weight loss, fever, chills, weakness or fatigue.  HEENT: Eyes: No visual loss, blurred vision, double vision or yellow sclerae.No hearing loss, sneezing, congestion, runny nose or sore throat.  SKIN: No rash or itching.  CARDIOVASCULAR:  RESPIRATORY: No shortness of breath, cough or sputum.  GASTROINTESTINAL: No anorexia, nausea, vomiting or diarrhea. No abdominal pain or blood.  GENITOURINARY: No burning on urination, no polyuria NEUROLOGICAL: No headache, dizziness, syncope, paralysis, ataxia, numbness or tingling in the extremities. No change in bowel or bladder control.  MUSCULOSKELETAL: No muscle, back pain, joint pain or stiffness.  LYMPHATICS: No enlarged nodes. No history of splenectomy.  PSYCHIATRIC: No history of depression or anxiety.    ENDOCRINOLOGIC: No reports of sweating, cold or heat intolerance. No polyuria or polydipsia.  Marland Kitchen   Physical Examination There were no vitals filed for this visit. There were no vitals filed for this visit.  Gen: resting comfortably, no acute distress HEENT: no scleral icterus, pupils equal round and reactive, no palptable cervical adenopathy,  CV Resp: Clear to auscultation bilaterally GI: abdomen is soft, non-tender, non-distended, normal bowel sounds, no hepatosplenomegaly MSK: extremities are warm, no edema.  Skin: warm, no rash Neuro:  no focal deficits Psych: appropriate affect   Diagnostic Studies  Jan 2016 Lexiscan FINDINGS: Stress/ECG findings: The patient was stressed according to the [facility] protocol. The heart rate ranged from 55 up to 96 beats per min. The blood pressure averaged 133/87. No chest pain was reported.  Baseline ECG demonstrated sinus rhythm. With stress, there were no ischemic ST segment or T-wave abnormalities nor any arrhythmias.  Perfusion: No decreased activity in the left ventricle on stress imaging to suggest reversible ischemia or infarction.  Wall Motion: Normal left ventricular wall motion. No left ventricular dilation.  Left Ventricular Ejection Fraction: 64 %  End diastolic volume 92 ml  End systolic volume 33 ml  IMPRESSION: 1. No reversible ischemia or infarction.  2. Normal left ventricular wall motion.  3. Left ventricular ejection fraction 64%  4. Low-risk stress test findings*.   Assessment and Plan  1. Chest pain - unclear etiology, multiple CAD risk factors as described above - patient unable to exercise due to chronic knee pain, will arrange for Lexiscan MPI - start  ASA 81mg  daily  F/u 4 weeks      Arnoldo Lenis, M.D., F.A.C.C.

## 2015-01-12 ENCOUNTER — Encounter: Payer: Self-pay | Admitting: Cardiology

## 2015-01-12 ENCOUNTER — Ambulatory Visit (INDEPENDENT_AMBULATORY_CARE_PROVIDER_SITE_OTHER): Payer: Self-pay | Admitting: Cardiology

## 2015-01-12 VITALS — BP 104/67 | HR 64 | Ht 68.0 in | Wt 229.0 lb

## 2015-01-12 DIAGNOSIS — R079 Chest pain, unspecified: Secondary | ICD-10-CM

## 2015-01-12 NOTE — Patient Instructions (Signed)
Your physician recommends that you schedule a follow-up appointment as needed with Dr. Harl Bowie  Your physician has recommended you make the following change in your medication:   START OVER THE COUNTER ZANTAC 150 MG TWICE DAILY  Thank you for choosing [facility]!!

## 2015-01-12 NOTE — Progress Notes (Signed)
Clinical Summary Mark Walter is a 59 y.o.male seen today for follow up of the following medical problems.   1. Chest pain - completed exercise MPI that showed now ischemia - since last visit denies any chest pain since last visit. No SOB or DOE.  - compliant with meds   Past Medical History  Diagnosis Date  . Hyperlipemia   . Tobacco abuse   . Pre-diabetes      No Known Allergies   Current Outpatient Prescriptions  Medication Sig Dispense Refill  . aspirin EC 81 MG tablet Take 1 tablet (81 mg total) by mouth daily.     No current facility-administered medications for this visit.     No past surgical history on file.   No Known Allergies    No family history on file.   Social History Mark Walter reports that he has been smoking Cigarettes.  He started smoking about 41 years ago. He has a 9.75 pack-year smoking history. He has never used smokeless tobacco. Mark Walter reports that he drinks alcohol.   Review of Systems CONSTITUTIONAL: No weight loss, fever, chills, weakness or fatigue.  HEENT: Eyes: No visual loss, blurred vision, double vision or yellow sclerae.No hearing loss, sneezing, congestion, runny nose or sore throat.  SKIN: No rash or itching.  CARDIOVASCULAR: per HPI RESPIRATORY: No shortness of breath, cough or sputum.  GASTROINTESTINAL: No anorexia, nausea, vomiting or diarrhea. No abdominal pain or blood.  GENITOURINARY: No burning on urination, no polyuria NEUROLOGICAL: No headache, dizziness, syncope, paralysis, ataxia, numbness or tingling in the extremities. No change in bowel or bladder control.  MUSCULOSKELETAL: No muscle, back pain, joint pain or stiffness.  LYMPHATICS: No enlarged nodes. No history of splenectomy.  PSYCHIATRIC: No history of depression or anxiety.  ENDOCRINOLOGIC: No reports of sweating, cold or heat intolerance. No polyuria or polydipsia.  Marland Kitchen   Physical Examination Filed Vitals:   01/12/15 1131  BP: 104/67    Pulse: 64   Filed Vitals:   01/12/15 1131  Height: 5\' 8"  (1.727 m)  Weight: 229 lb (103.874 kg)    Gen: resting comfortably, no acute distress HEENT: no scleral icterus, pupils equal round and reactive, no palptable cervical adenopathy,  CV: RRR, no m/r/g, no JVD, no carotid bruits Resp: Clear to auscultation bilaterally GI: abdomen is soft, non-tender, non-distended, normal bowel sounds, no hepatosplenomegaly MSK: extremities are warm, no edema.  Skin: warm, no rash Neuro:  no focal deficits Psych: appropriate affect   Diagnostic Studies Jan 2016 Lexiscan FINDINGS: Stress/ECG findings: The patient was stressed according to the [facility] protocol. The heart rate ranged from 55 up to 96 beats per min. The blood pressure averaged 133/87. No chest pain was reported.  Baseline ECG demonstrated sinus rhythm. With stress, there were no ischemic ST segment or T-wave abnormalities nor any arrhythmias.  Perfusion: No decreased activity in the left ventricle on stress imaging to suggest reversible ischemia or infarction.  Wall Motion: Normal left ventricular wall motion. No left ventricular dilation.  Left Ventricular Ejection Fraction: 64 %  End diastolic volume 92 ml  End systolic volume 33 ml  IMPRESSION: 1. No reversible ischemia or infarction.  2. Normal left ventricular wall motion.  3. Left ventricular ejection fraction 64%  4. Low-risk stress test findings*.    Assessment and Plan  1. Chest pain - stress test with no evidence of ischemia. No recurrent symptoms. Continue risk factor modfication   F/u as need      Roderic Palau  [place], M.D.

## 2015-09-08 ENCOUNTER — Ambulatory Visit: Payer: Self-pay | Admitting: Physician Assistant

## 2015-09-08 ENCOUNTER — Encounter: Payer: Self-pay | Admitting: Physician Assistant

## 2015-09-08 VITALS — BP 144/84 | HR 66 | Temp 97.9°F | Ht 68.0 in | Wt 231.2 lb

## 2015-09-08 DIAGNOSIS — F17210 Nicotine dependence, cigarettes, uncomplicated: Secondary | ICD-10-CM

## 2015-09-08 DIAGNOSIS — Z125 Encounter for screening for malignant neoplasm of prostate: Secondary | ICD-10-CM

## 2015-09-08 DIAGNOSIS — I10 Essential (primary) hypertension: Secondary | ICD-10-CM

## 2015-09-08 DIAGNOSIS — M25562 Pain in left knee: Secondary | ICD-10-CM

## 2015-09-08 DIAGNOSIS — R7303 Prediabetes: Secondary | ICD-10-CM

## 2015-09-08 DIAGNOSIS — B353 Tinea pedis: Secondary | ICD-10-CM

## 2015-09-08 DIAGNOSIS — E785 Hyperlipidemia, unspecified: Secondary | ICD-10-CM

## 2015-09-08 DIAGNOSIS — M25561 Pain in right knee: Secondary | ICD-10-CM

## 2015-09-08 DIAGNOSIS — Z9119 Patient's noncompliance with other medical treatment and regimen: Secondary | ICD-10-CM

## 2015-09-08 DIAGNOSIS — Z91199 Patient's noncompliance with other medical treatment and regimen due to unspecified reason: Secondary | ICD-10-CM

## 2015-09-08 MED ORDER — NYSTATIN 100000 UNIT/GM EX CREA: 1.0000 "application " | TOPICAL_CREAM | Freq: Two times a day (BID) | CUTANEOUS | Status: DC

## 2015-09-08 MED ORDER — METOPROLOL TARTRATE 50 MG PO TABS: 50.0000 mg | ORAL_TABLET | Freq: Two times a day (BID) | ORAL | Status: DC

## 2015-09-08 NOTE — Patient Instructions (Signed)
Athlete's Foot Athlete's foot (tinea pedis) is a fungal infection of the skin on the feet. It often occurs on the skin between the toes or underneath the toes. It can also occur on the soles of the feet. Athlete's foot is more likely to occur in hot, humid weather. Not washing your feet or changing your socks often enough can contribute to athlete's foot. The infection can spread from person to person (contagious). CAUSES Athlete's foot is caused by a fungus. This fungus thrives in warm, moist places. Most people get athlete's foot by sharing shower stalls, towels, and wet floors with an infected person. People with weakened immune systems, including those with diabetes, may be more likely to get athlete's foot. SYMPTOMS   Itchy areas between the toes or on the soles of the feet.  White, flaky, or scaly areas between the toes or on the soles of the feet.  Tiny, intensely itchy blisters between the toes or on the soles of the feet.  Tiny cuts on the skin. These cuts can develop a bacterial infection.  Thick or discolored toenails.  TREATMENT  Over-the-counter and prescription medicines can be used to kill the fungus. These medicines are available as powders or creams. Your caregiver can suggest medicines for you. Fungal infections respond slowly to treatment. You may need to continue using your medicine for several weeks. PREVENTION   Do not share towels.  Wear sandals in wet areas, such as shared locker rooms and shared showers.  Keep your feet dry. Wear shoes that allow air to circulate. Wear cotton or wool socks. HOME CARE INSTRUCTIONS   Take medicines as directed by your caregiver. Do not use steroid creams on athlete's foot.  Keep your feet clean and cool. Wash your feet daily and dry them thoroughly, especially between your toes.  Change your socks every day. Wear cotton or wool socks. In hot climates, you may need to change your socks 2 to 3 times per day.  Wear sandals or  canvas tennis shoes with good air circulation.  If you have blisters, soak your feet in Burow's solution or Epsom salts for 20 to 30 minutes, 2 times a day to dry out the blisters. Make sure you dry your feet thoroughly afterward. SEEK MEDICAL CARE IF:   You have a fever.  You have swelling, soreness, warmth, or redness in your foot.  You are not getting better after 7 days of treatment.  You are not completely cured after 30 days.  You have any problems caused by your medicines. MAKE SURE YOU:   Understand these instructions.  Will watch your condition.  Will get help right away if you are not doing well or get worse.   This information is not intended to replace advice given to you by your health care provider. Make sure you discuss any questions you have with your health care provider.   Document Released: 08/24/2000 Document Revised: 11/19/2011 Document Reviewed: 02/28/2015 Elsevier Interactive Patient Education [employer].

## 2015-09-08 NOTE — Progress Notes (Signed)
BP 144/84 mmHg  Pulse 66  Temp(Src) 97.9 F (36.6 C)  Ht 5\' 8"  (1.727 m)  Wt 231 lb 3.2 oz (104.872 kg)  BMI 35.16 kg/m2  SpO2 97%   Subjective:    Patient ID: Mark Walter, male    DOB: 1956-01-22, 59 y.o.   MRN: [id number]  HPI: Mark Walter is a 59 y.o. male presenting on 09/08/2015 for Follow-up   HPI  Pt has not been seen in office since Jan 25 2015.  He was supposed to f/u may 31 but he no-showed for that appt.  He says he came in now b/c his knees hurt.  Pt states he sweats at night.   Relevant past medical, surgical, family and social history reviewed and updated as indicated. Interim medical history since our last visit reviewed. Allergies and medications reviewed and updated.  Current outpatient prescriptions:  .  aspirin EC 81 MG tablet, Take 1 tablet (81 mg total) by mouth daily. (Patient not taking: Reported on 09/08/2015), Disp: , Rfl:    Review of Systems  Constitutional: Positive for diaphoresis. Negative for fever, chills, appetite change, fatigue and unexpected weight change.  HENT: Positive for dental problem. Negative for congestion, drooling, ear pain, facial swelling, hearing loss, mouth sores, sneezing, sore throat, trouble swallowing and voice change.   Eyes: Positive for itching. Negative for pain, discharge, redness and visual disturbance.  Respiratory: Negative for cough, choking, shortness of breath and wheezing.   Cardiovascular: Positive for leg swelling. Negative for chest pain and palpitations.  Gastrointestinal: Negative for vomiting, abdominal pain, diarrhea, constipation and blood in stool.  Endocrine: Negative for cold intolerance, heat intolerance and polydipsia.  Genitourinary: Negative for dysuria, hematuria and decreased urine volume.  Musculoskeletal: Negative for back pain, arthralgias and gait problem.  Skin: Negative for rash.  Allergic/Immunologic: Negative for environmental allergies.  Neurological: Negative for  seizures, syncope, light-headedness and headaches.  Hematological: Negative for adenopathy.  Psychiatric/Behavioral: Negative for suicidal ideas, dysphoric mood and agitation. The patient is not nervous/anxious.     Per HPI unless specifically indicated above     Objective:    BP 144/84 mmHg  Pulse 66  Temp(Src) 97.9 F (36.6 C)  Ht 5\' 8"  (1.727 m)  Wt 231 lb 3.2 oz (104.872 kg)  BMI 35.16 kg/m2  SpO2 97%  Wt Readings from Last 3 Encounters:  09/08/15 231 lb 3.2 oz (104.872 kg)  01/12/15 229 lb (103.874 kg)  09/23/14 234 lb (106.142 kg)    Physical Exam  Constitutional: He is oriented to person, place, and time. He appears well-developed and well-nourished.  HENT:  Head: Normocephalic and atraumatic.  Neck: Neck supple.  Cardiovascular: Normal rate and regular rhythm.   Pulmonary/Chest: Effort normal and breath sounds normal. He has no wheezes.  Abdominal: Soft. Bowel sounds are normal. There is no tenderness.  Musculoskeletal: He exhibits no edema.       Right knee: He exhibits normal range of motion, no swelling and no effusion. Tenderness (mild diffuse) found.       Left knee: He exhibits normal range of motion, no swelling and no effusion. Tenderness (mild diffuse) found.  Lymphadenopathy:    He has no cervical adenopathy.  Neurological: He is alert and oriented to person, place, and time.  Skin: Skin is warm and dry.  Extensive flaking B feet with maceration in many webspaces.  No secondary infection seen.  Psychiatric: He has a normal mood and affect. His behavior is normal.  Vitals reviewed.  Assessment & Plan:   Encounter Diagnoses  Name Primary?  . Essential hypertension, benign Yes  . Prediabetes   . Hyperlipidemia   . Screening for prostate cancer   . Personal history of noncompliance with medical treatment, presenting hazards to health   . Cigarette nicotine dependence, uncomplicated   . Arthralgia of both knees   . Tinea pedis of both feet      -pt to Get labs drawn.  -pt to Get back on metoprolol -pt counseled to Take asa every day -rx nystatin and counseled on foot care. -Counseled on smoking cessation -Counseled pt on need to come in for appts -F/u 1 mo

## 2015-09-09 LAB — COMPLETE METABOLIC PANEL WITH GFR
ALT: 19 U/L (ref 9–46)
AST: 20 U/L (ref 10–35)
Albumin: 3.7 g/dL (ref 3.6–5.1)
Alkaline Phosphatase: 54 U/L (ref 40–115)
BILIRUBIN TOTAL: 0.3 mg/dL (ref 0.2–1.2)
BUN: 8 mg/dL (ref 7–25)
CO2: 26 mmol/L (ref 20–31)
CREATININE: 1.02 mg/dL (ref 0.70–1.25)
Calcium: 9.1 mg/dL (ref 8.6–10.3)
Chloride: 105 mmol/L (ref 98–110)
GFR, Est African American: >89 mL/min (ref >=60)
GFR, Est Non African American: 79 mL/min (ref >=60)
GLUCOSE: 95 mg/dL (ref 65–99)
Potassium: 4.8 mmol/L (ref 3.5–5.3)
SODIUM: 139 mmol/L (ref 135–146)
TOTAL PROTEIN: 6.3 g/dL (ref 6.1–8.1)

## 2015-09-09 LAB — LIPID PANEL
Cholesterol: 238 mg/dL — ABNORMAL HIGH (ref 125–200)
HDL: 49 mg/dL (ref >=40)
LDL CALC: 158 mg/dL — AB (ref <130)
Total CHOL/HDL Ratio: 4.9 Ratio (ref <=5.0)
Triglycerides: 153 mg/dL — ABNORMAL HIGH (ref <150)
VLDL: 31 mg/dL — AB (ref <30)

## 2015-09-10 LAB — HEMOGLOBIN A1C
Hgb A1c MFr Bld: 6.2 % — ABNORMAL HIGH (ref <5.7)
MEAN PLASMA GLUCOSE: 131 mg/dL — AB (ref <117)

## 2015-09-10 LAB — PSA: PSA: 0.63 ng/mL (ref <=4.00)

## 2015-09-12 DIAGNOSIS — E785 Hyperlipidemia, unspecified: Secondary | ICD-10-CM | POA: Insufficient documentation

## 2015-09-12 DIAGNOSIS — B353 Tinea pedis: Secondary | ICD-10-CM | POA: Insufficient documentation

## 2015-09-12 DIAGNOSIS — F17210 Nicotine dependence, cigarettes, uncomplicated: Secondary | ICD-10-CM | POA: Insufficient documentation

## 2015-09-12 DIAGNOSIS — I10 Essential (primary) hypertension: Secondary | ICD-10-CM | POA: Insufficient documentation

## 2015-09-12 DIAGNOSIS — R7303 Prediabetes: Secondary | ICD-10-CM | POA: Insufficient documentation

## 2015-10-05 ENCOUNTER — Ambulatory Visit: Payer: Self-pay | Admitting: Physician Assistant

## 2015-10-05 ENCOUNTER — Encounter: Payer: Self-pay | Admitting: Physician Assistant

## 2015-10-05 VITALS — BP 126/74 | HR 69 | Temp 98.1°F | Ht 68.0 in | Wt 232.8 lb

## 2015-10-05 DIAGNOSIS — I10 Essential (primary) hypertension: Secondary | ICD-10-CM

## 2015-10-05 DIAGNOSIS — F17210 Nicotine dependence, cigarettes, uncomplicated: Secondary | ICD-10-CM

## 2015-10-05 DIAGNOSIS — R7303 Prediabetes: Secondary | ICD-10-CM

## 2015-10-05 DIAGNOSIS — M25561 Pain in right knee: Secondary | ICD-10-CM

## 2015-10-05 DIAGNOSIS — E785 Hyperlipidemia, unspecified: Secondary | ICD-10-CM

## 2015-10-05 DIAGNOSIS — M25562 Pain in left knee: Secondary | ICD-10-CM

## 2015-10-05 MED ORDER — METOPROLOL TARTRATE 50 MG PO TABS: 50.0000 mg | ORAL_TABLET | Freq: Two times a day (BID) | ORAL | Status: DC

## 2015-10-05 MED ORDER — ATORVASTATIN CALCIUM 20 MG PO TABS: 20.0000 mg | ORAL_TABLET | Freq: Every day | ORAL | Status: DC

## 2015-10-05 NOTE — Patient Instructions (Signed)

## 2015-10-05 NOTE — Progress Notes (Signed)
BP 126/74 mmHg  Pulse 69  Temp(Src) 98.1 F (36.7 C)  Ht 5\' 8"  (1.727 m)  Wt 232 lb 12.8 oz (105.597 kg)  BMI 35.41 kg/m2  SpO2 97%   Subjective:    Patient ID: Mark Walter, male    DOB: 1956-01-01, 60 y.o.   MRN: [id number]  HPI: Mark Walter is a 60 y.o. male presenting on 10/05/2015 for Hypertension and Knee Pain   HPI   Pt states L knee been bothering him since forever.  Worse walking up steps.   States R bothers him occ but mostly just the L.   Relevant past medical, surgical, family and social history reviewed and updated as indicated. Interim medical history since our last visit reviewed. Allergies and medications reviewed and updated.   Current outpatient prescriptions:  .  aspirin EC 81 MG tablet, Take 1 tablet (81 mg total) by mouth daily., Disp: , Rfl:  .  metoprolol (LOPRESSOR) 50 MG tablet, Take 1 tablet (50 mg total) by mouth 2 (two) times daily. (Patient taking differently: Take 50 mg by mouth daily. ), Disp: 60 tablet, Rfl: 1 .  nystatin cream (MYCOSTATIN), Apply 1 application topically 2 (two) times daily. (Patient taking differently: Apply 1 application topically at bedtime. ), Disp: 30 g, Rfl: 0   Review of Systems  Constitutional: Negative for fever, chills, diaphoresis, appetite change, fatigue and unexpected weight change.  HENT: Negative for congestion, dental problem, drooling, ear pain, facial swelling, hearing loss, mouth sores, sneezing, sore throat, trouble swallowing and voice change.   Eyes: Negative for pain, discharge, redness, itching and visual disturbance.  Respiratory: Negative for cough, choking, shortness of breath and wheezing.   Cardiovascular: Positive for leg swelling. Negative for chest pain and palpitations.  Gastrointestinal: Negative for vomiting, abdominal pain, diarrhea, constipation and blood in stool.  Endocrine: Negative for cold intolerance, heat intolerance and polydipsia.  Genitourinary: Negative for dysuria,  hematuria and decreased urine volume.  Musculoskeletal: Positive for arthralgias. Negative for back pain and gait problem.  Skin: Negative for rash.  Allergic/Immunologic: Negative for environmental allergies.  Neurological: Negative for seizures, syncope, light-headedness and headaches.  Hematological: Negative for adenopathy.  Psychiatric/Behavioral: Negative for suicidal ideas, dysphoric mood and agitation. The patient is not nervous/anxious.     Per HPI unless specifically indicated above     Objective:    BP 126/74 mmHg  Pulse 69  Temp(Src) 98.1 F (36.7 C)  Ht 5\' 8"  (1.727 m)  Wt 232 lb 12.8 oz (105.597 kg)  BMI 35.41 kg/m2  SpO2 97%  Wt Readings from Last 3 Encounters:  10/05/15 232 lb 12.8 oz (105.597 kg)  09/08/15 231 lb 3.2 oz (104.872 kg)  01/12/15 229 lb (103.874 kg)    Physical Exam  Constitutional: He is oriented to person, place, and time. He appears well-developed and well-nourished.  HENT:  Head: Normocephalic and atraumatic.  Neck: Neck supple.  Cardiovascular: Normal rate and regular rhythm.   Pulmonary/Chest: Effort normal and breath sounds normal. He has no wheezes.  Abdominal: Soft. Bowel sounds are normal. There is no tenderness.  Musculoskeletal: He exhibits no edema.       Right knee: Normal.       Left knee: Normal.  Lymphadenopathy:    He has no cervical adenopathy.  Neurological: He is alert and oriented to person, place, and time.  Skin: Skin is warm and dry.  Psychiatric: He has a normal mood and affect. His behavior is normal.  Vitals reviewed.  Results for orders placed or performed in visit on 09/08/15  HgB A1c  Result Value Ref Range   Hgb A1c MFr Bld 6.2 (H) <5.7 %   Mean Plasma Glucose 131 (H) <117 mg/dL  COMPLETE METABOLIC PANEL WITH GFR  Result Value Ref Range   Sodium 139 135 - 146 mmol/L   Potassium 4.8 3.5 - 5.3 mmol/L   Chloride 105 98 - 110 mmol/L   CO2 26 20 - 31 mmol/L   Glucose, Bld 95 65 - 99 mg/dL   BUN 8 7 -  25 mg/dL   Creat 1.02 0.70 - 1.25 mg/dL   Total Bilirubin 0.3 0.2 - 1.2 mg/dL   Alkaline Phosphatase 54 40 - 115 U/L   AST 20 10 - 35 U/L   ALT 19 9 - 46 U/L   Total Protein 6.3 6.1 - 8.1 g/dL   Albumin 3.7 3.6 - 5.1 g/dL   Calcium 9.1 8.6 - 10.3 mg/dL   GFR, Est African American >89 >=60 mL/min   GFR, Est Non African American 79 >=60 mL/min  PSA  Result Value Ref Range   PSA 0.63 <=4.00 ng/mL  Lipid Profile  Result Value Ref Range   Cholesterol 238 (H) 125 - 200 mg/dL   Triglycerides 153 (H) <150 mg/dL   HDL 49 >=40 mg/dL   Total CHOL/HDL Ratio 4.9 <=5.0 Ratio   VLDL 31 (H) <30 mg/dL   LDL Cholesterol 158 (H) <130 mg/dL      Assessment & Plan:   Encounter Diagnoses  Name Primary?  . Essential hypertension, benign Yes  . Hyperlipidemia   . Prediabetes   . Cigarette nicotine dependence, uncomplicated   . Arthralgia of both knees     -reviewed labs with pt -will get pt signed up for [facility] and order lipitor and metoprolol -counseled on hyperlipidemia. rx lipitor and gave low-fat diet -Counseled on prediabetes. Gave reading information -counseled on smoking cessation -F/u 3 mo- future labs

## 2015-10-09 DIAGNOSIS — M25562 Pain in left knee: Secondary | ICD-10-CM

## 2015-10-09 DIAGNOSIS — M25561 Pain in right knee: Secondary | ICD-10-CM | POA: Insufficient documentation

## 2015-12-28 ENCOUNTER — Other Ambulatory Visit: Payer: Self-pay | Admitting: Student

## 2015-12-28 DIAGNOSIS — I10 Essential (primary) hypertension: Secondary | ICD-10-CM

## 2015-12-28 DIAGNOSIS — E785 Hyperlipidemia, unspecified: Secondary | ICD-10-CM

## 2015-12-31 LAB — COMPLETE METABOLIC PANEL WITH GFR
ALT: 15 U/L (ref 9–46)
AST: 15 U/L (ref 10–35)
Albumin: 3.8 g/dL (ref 3.6–5.1)
Alkaline Phosphatase: 60 U/L (ref 40–115)
BUN: 13 mg/dL (ref 7–25)
CO2: 25 mmol/L (ref 20–31)
Calcium: 9.7 mg/dL (ref 8.6–10.3)
Chloride: 108 mmol/L (ref 98–110)
Creat: 1.05 mg/dL (ref 0.70–1.25)
GFR, EST NON AFRICAN AMERICAN: 76 mL/min (ref >=60)
GFR, Est African American: 88 mL/min (ref >=60)
GLUCOSE: 93 mg/dL (ref 65–99)
Potassium: 5.2 mmol/L (ref 3.5–5.3)
SODIUM: 141 mmol/L (ref 135–146)
Total Bilirubin: 0.4 mg/dL (ref 0.2–1.2)
Total Protein: 7.1 g/dL (ref 6.1–8.1)

## 2015-12-31 LAB — LIPID PANEL
Cholesterol: 190 mg/dL (ref 125–200)
HDL: 51 mg/dL (ref >=40)
LDL CALC: 116 mg/dL (ref <130)
TRIGLYCERIDES: 117 mg/dL (ref <150)
Total CHOL/HDL Ratio: 3.7 Ratio (ref <=5.0)
VLDL: 23 mg/dL (ref <30)

## 2016-01-03 ENCOUNTER — Encounter: Payer: Self-pay | Admitting: Physician Assistant

## 2016-01-03 ENCOUNTER — Ambulatory Visit: Payer: Self-pay | Admitting: Physician Assistant

## 2016-01-03 VITALS — BP 126/60 | HR 70 | Temp 97.9°F | Ht 68.0 in | Wt 222.5 lb

## 2016-01-03 DIAGNOSIS — F17210 Nicotine dependence, cigarettes, uncomplicated: Secondary | ICD-10-CM

## 2016-01-03 DIAGNOSIS — E785 Hyperlipidemia, unspecified: Secondary | ICD-10-CM

## 2016-01-03 DIAGNOSIS — E669 Obesity, unspecified: Secondary | ICD-10-CM | POA: Insufficient documentation

## 2016-01-03 DIAGNOSIS — Z1211 Encounter for screening for malignant neoplasm of colon: Secondary | ICD-10-CM

## 2016-01-03 DIAGNOSIS — N529 Male erectile dysfunction, unspecified: Secondary | ICD-10-CM | POA: Insufficient documentation

## 2016-01-03 DIAGNOSIS — I10 Essential (primary) hypertension: Secondary | ICD-10-CM

## 2016-01-03 MED ORDER — SILDENAFIL CITRATE 100 MG PO TABS: 50.0000 mg | ORAL_TABLET | Freq: Every day | ORAL | Status: DC | PRN

## 2016-01-03 NOTE — Patient Instructions (Signed)
Sildenafil tablets (Viagra) What is this medicine? SILDENAFIL (sil DEN a fil) is used to treat erection problems in men. This medicine may be used for other purposes; ask your health care provider or pharmacist if you have questions. What should I tell my health care provider before I take this medicine? They need to know if you have any of these conditions: -bleeding disorders -eye or vision problems, including a rare inherited eye disease called retinitis pigmentosa -anatomical deformation of the penis, Peyronie's disease, or history of priapism (painful and prolonged erection) -heart disease, angina, a history of heart attack, irregular heart beats, or other heart problems -high or low blood pressure -history of blood diseases, like sickle cell anemia or leukemia -history of stomach bleeding -kidney disease -liver disease -stroke -an unusual or allergic reaction to sildenafil, other medicines, foods, dyes, or preservatives -pregnant or trying to get pregnant -breast-feeding How should I use this medicine? Take this medicine by mouth with a glass of water. Follow the directions on the prescription label. The dose is usually taken 1 hour before sexual activity. You should not take the dose more than once per day. Do not take your medicine more often than directed. Talk to your pediatrician regarding the use of this medicine in children. This medicine is not used in children for this condition. Overdosage: If you think you have taken too much of this medicine contact a poison control center or emergency room at once. NOTE: This medicine is only for you. Do not share this medicine with others. What if I miss a dose? This does not apply. Do not take double or extra doses. What may interact with this medicine? Do not take this medicine with any of the following medications: -cisapride -methscopolamine nitrate -nitrates like amyl nitrite, isosorbide dinitrate, isosorbide mononitrate,  nitroglycerin -nitroprusside -other medicines for erectile dysfunction like avanafil, tadalafil, vardenafil -riociguat -other sildenafil products (Revatio) This medicine may also interact with the following medications: -certain drugs for high blood pressure -certain drugs for the treatment of HIV infection or AIDS -certain drugs used for fungal or yeast infections, like fluconazole, itraconazole, ketoconazole, and voriconazole -cimetidine -erythromycin -rifampin This list may not describe all possible interactions. Give your health care provider a list of all the medicines, herbs, non-prescription drugs, or dietary supplements you use. Also tell them if you smoke, drink alcohol, or use illegal drugs. Some items may interact with your medicine. What should I watch for while using this medicine? If you notice any changes in your vision while taking this drug, call your doctor or health care professional as soon as possible. Stop using this medicine and call your health care provider right away if you have a loss of sight in one or both eyes. Contact your doctor or health care professional right away if you have an erection that lasts longer than 4 hours or if it becomes painful. This may be a sign of a serious problem and must be treated right away to prevent permanent damage. If you experience symptoms of nausea, dizziness, chest pain or arm pain upon initiation of sexual activity after taking this medicine, you should refrain from further activity and call your doctor or health care professional as soon as possible. Do not drink alcohol to excess (examples, 5 glasses of wine or 5 shots of whiskey) when taking this medicine. When taken in excess, alcohol can increase your chances of getting a headache or getting dizzy, increasing your heart rate or lowering your blood pressure. Using this medicine  does not protect you or your partner against HIV infection (the virus that causes AIDS) or other  sexually transmitted diseases. What side effects may I notice from receiving this medicine? Side effects that you should report to your doctor or health care professional as soon as possible: -allergic reactions like skin rash, itching or hives, swelling of the face, lips, or tongue -breathing problems -changes in hearing -changes in vision -chest pain -fast, irregular heartbeat -prolonged or painful erection -seizures Side effects that usually do not require medical attention (report to your doctor or health care professional if they continue or are bothersome): -back pain -dizziness -flushing -headache -indigestion -muscle aches -nausea -stuffy or runny nose This list may not describe all possible side effects. Call your doctor for medical advice about side effects. You may report side effects to FDA at 1-800-FDA-1088. Where should I keep my medicine? Keep out of reach of children. Store at room temperature between 15 and 30 degrees C (59 and 86 degrees F). Throw away any unused medicine after the expiration date. NOTE: This sheet is a summary. It may not cover all possible information. If you have questions about this medicine, talk to your doctor, pharmacist, or health care provider.    2016, [employer]. (2014-01-15 13:19:04)

## 2016-01-03 NOTE — Progress Notes (Signed)
BP 126/60 mmHg  Pulse 70  Temp(Src) 97.9 F (36.6 C)  Ht 5\' 8"  (1.727 m)  Wt 222 lb 8 oz (100.925 kg)  BMI 33.84 kg/m2  SpO2 97%   Subjective:    Patient ID: Mark Walter, male    DOB: [birth year], 60 y.o.   MRN: [id number]  HPI: Mark Walter is a 60 y.o. male presenting on 01/03/2016 for Hypertension and Hyperlipidemia   HPI   Pt is feeling well today.  No complaints.  He is still smoking  Relevant past medical, surgical, family and social history reviewed and updated as indicated. Interim medical history since our last visit reviewed. Allergies and medications reviewed and updated.   Current outpatient prescriptions:  .  aspirin EC 81 MG tablet, Take 1 tablet (81 mg total) by mouth daily., Disp: , Rfl:  .  atorvastatin (LIPITOR) 20 MG tablet, Take 1 tablet (20 mg total) by mouth daily., Disp: 90 tablet, Rfl: 1 .  metoprolol (LOPRESSOR) 50 MG tablet, Take 1 tablet (50 mg total) by mouth 2 (two) times daily., Disp: 180 tablet, Rfl: 1 .  nystatin cream (MYCOSTATIN), Apply 1 application topically 2 (two) times daily. (Patient taking differently: Apply 1 application topically at bedtime. ), Disp: 30 g, Rfl: 0  Review of Systems  Constitutional: Negative for fever, chills, diaphoresis, appetite change, fatigue and unexpected weight change.  HENT: Positive for dental problem. Negative for congestion, drooling, ear pain, facial swelling, hearing loss, mouth sores, sneezing, sore throat, trouble swallowing and voice change.   Eyes: Negative for pain, discharge, redness, itching and visual disturbance.  Respiratory: Negative for cough, choking, shortness of breath and wheezing.   Cardiovascular: Positive for leg swelling. Negative for chest pain and palpitations.  Gastrointestinal: Negative for vomiting, abdominal pain, diarrhea, constipation and blood in stool.  Endocrine: Negative for cold intolerance, heat intolerance and polydipsia.  Genitourinary: Negative for dysuria,  hematuria and decreased urine volume.  Musculoskeletal: Negative for back pain, arthralgias and gait problem.  Skin: Negative for rash.  Allergic/Immunologic: Negative for environmental allergies.  Neurological: Negative for seizures, syncope, light-headedness and headaches.  Hematological: Negative for adenopathy.  Psychiatric/Behavioral: Negative for suicidal ideas, dysphoric mood and agitation. The patient is not nervous/anxious.     Per HPI unless specifically indicated above     Objective:    BP 126/60 mmHg  Pulse 70  Temp(Src) 97.9 F (36.6 C)  Ht 5\' 8"  (1.727 m)  Wt 222 lb 8 oz (100.925 kg)  BMI 33.84 kg/m2  SpO2 97%  Wt Readings from Last 3 Encounters:  01/03/16 222 lb 8 oz (100.925 kg)  10/05/15 232 lb 12.8 oz (105.597 kg)  09/08/15 231 lb 3.2 oz (104.872 kg)    Physical Exam  Constitutional: He is oriented to person, place, and time. He appears well-developed and well-nourished.  HENT:  Head: Normocephalic and atraumatic.  Neck: Neck supple.  Cardiovascular: Normal rate and regular rhythm.   Pulmonary/Chest: Effort normal and breath sounds normal. He has no wheezes.  Abdominal: Soft. Bowel sounds are normal. There is no hepatosplenomegaly. There is no tenderness.  Musculoskeletal: He exhibits no edema.  Lymphadenopathy:    He has no cervical adenopathy.  Neurological: He is alert and oriented to person, place, and time.  Skin: Skin is warm and dry.  Psychiatric: He has a normal mood and affect. His behavior is normal.  Vitals reviewed.   Results for orders placed or performed in visit on 12/28/15  COMPLETE METABOLIC PANEL WITH GFR  Result Value Ref Range   Sodium 141 135 - 146 mmol/L   Potassium 5.2 3.5 - 5.3 mmol/L   Chloride 108 98 - 110 mmol/L   CO2 25 20 - 31 mmol/L   Glucose, Bld 93 65 - 99 mg/dL   BUN 13 7 - 25 mg/dL   Creat 1.05 0.70 - 1.25 mg/dL   Total Bilirubin 0.4 0.2 - 1.2 mg/dL   Alkaline Phosphatase 60 40 - 115 U/L   AST 15 10 - 35 U/L    ALT 15 9 - 46 U/L   Total Protein 7.1 6.1 - 8.1 g/dL   Albumin 3.8 3.6 - 5.1 g/dL   Calcium 9.7 8.6 - 10.3 mg/dL   GFR, Est African American 88 >=60 mL/min   GFR, Est Non African American 76 >=60 mL/min  Lipid Profile  Result Value Ref Range   Cholesterol 190 125 - 200 mg/dL   Triglycerides 117 <150 mg/dL   HDL 51 >=40 mg/dL   Total CHOL/HDL Ratio 3.7 <=5.0 Ratio   VLDL 23 <30 mg/dL   LDL Cholesterol 116 <130 mg/dL      Assessment & Plan:   Encounter Diagnoses  Name Primary?  . Essential hypertension, benign Yes  . Hyperlipidemia   . Erectile dysfunction, unspecified erectile dysfunction type   . Special screening for malignant neoplasms, colon   . Cigarette nicotine dependence, uncomplicated   . Obesity, unspecified     -reviewed labs with pt -Gave iFOBT -counseled on Smoking cesstion -renewed viagra Rx -F/u 3 months.  RTO sooner prn

## 2016-01-18 LAB — IFOBT (OCCULT BLOOD): IFOBT: NEGATIVE

## 2016-03-14 ENCOUNTER — Encounter: Payer: Self-pay | Admitting: Physician Assistant

## 2016-03-14 ENCOUNTER — Ambulatory Visit: Payer: Self-pay | Admitting: Physician Assistant

## 2016-03-14 VITALS — BP 144/76 | HR 63 | Temp 97.7°F | Ht 68.0 in | Wt 226.2 lb

## 2016-03-14 DIAGNOSIS — M25511 Pain in right shoulder: Secondary | ICD-10-CM

## 2016-03-14 MED ORDER — MELOXICAM 15 MG PO TABS: 15.0000 mg | ORAL_TABLET | Freq: Every day | ORAL | Status: DC

## 2016-03-14 NOTE — Progress Notes (Signed)
   BP 144/76 mmHg  Pulse 63  Temp(Src) 97.7 F (36.5 C)  Ht 5\' 8"  (1.727 m)  Wt 226 lb 3.2 oz (102.604 kg)  BMI 34.40 kg/m2  SpO2 97%   Subjective:    Patient ID: Mark Walter, male    DOB: 06-03-1956, 60 y.o.   MRN: [id number]  HPI: Mark Walter is a 60 y.o. male presenting on 03/14/2016 for Shoulder Pain   HPI   Chief Complaint  Patient presents with  . Shoulder Pain    states hurt right shoulder a couple of weeks ago while moving a tree limb in the yard   Pt says he was trying to yank a branch and it is still sore. Says it was about 2 wk ago.  He used a cream on it.    Relevant past medical, surgical, family and social history reviewed and updated as indicated. Interim medical history since our last visit reviewed. Allergies and medications reviewed and updated.  Current outpatient prescriptions:  .  aspirin EC 81 MG tablet, Take 1 tablet (81 mg total) by mouth daily., Disp: , Rfl:  .  atorvastatin (LIPITOR) 20 MG tablet, Take 1 tablet (20 mg total) by mouth daily., Disp: 90 tablet, Rfl: 1 .  metoprolol (LOPRESSOR) 50 MG tablet, Take 1 tablet (50 mg total) by mouth 2 (two) times daily., Disp: 180 tablet, Rfl: 1 .  sildenafil (VIAGRA) 100 MG tablet, Take 0.5-1 tablets (50-100 mg total) by mouth daily as needed for erectile dysfunction., Disp: 10 tablet, Rfl: 2 .  nystatin cream (MYCOSTATIN), Apply 1 application topically 2 (two) times daily. (Patient not taking: Reported on 03/14/2016), Disp: 30 g, Rfl: 0  Review of Systems  Per HPI unless specifically indicated above     Objective:    BP 144/76 mmHg  Pulse 63  Temp(Src) 97.7 F (36.5 C)  Ht 5\' 8"  (1.727 m)  Wt 226 lb 3.2 oz (102.604 kg)  BMI 34.40 kg/m2  SpO2 97%  Wt Readings from Last 3 Encounters:  03/14/16 226 lb 3.2 oz (102.604 kg)  01/03/16 222 lb 8 oz (100.925 kg)  10/05/15 232 lb 12.8 oz (105.597 kg)    Physical Exam  Constitutional: He is oriented to person, place, and time. He appears  well-developed and well-nourished.  Pulmonary/Chest: Effort normal.  Musculoskeletal:       Right shoulder: Normal. He exhibits normal range of motion, no tenderness, no bony tenderness, no swelling, no effusion, no crepitus, no deformity, no laceration and no spasm.  Neurological: He is alert and oriented to person, place, and time.  Skin: Skin is warm and dry.  Psychiatric: He has a normal mood and affect. His behavior is normal.  Nursing note and vitals reviewed.       Assessment & Plan:     Encounter Diagnosis  Name Primary?  . Pain in joint of right shoulder Yes       rx mobic. Avoid overuse. Ice/heat. Gave reading information F/u later this month as scheduled for htn, chol. RTO sooner prn

## 2016-03-14 NOTE — Patient Instructions (Signed)

## 2016-03-27 ENCOUNTER — Other Ambulatory Visit: Payer: Self-pay

## 2016-03-27 DIAGNOSIS — E785 Hyperlipidemia, unspecified: Secondary | ICD-10-CM

## 2016-03-27 DIAGNOSIS — I10 Essential (primary) hypertension: Secondary | ICD-10-CM

## 2016-04-03 ENCOUNTER — Ambulatory Visit: Payer: Self-pay | Admitting: Physician Assistant

## 2016-04-03 ENCOUNTER — Encounter: Payer: Self-pay | Admitting: Physician Assistant

## 2016-04-03 VITALS — BP 130/70 | HR 56 | Temp 97.7°F | Ht 68.0 in | Wt 229.8 lb

## 2016-04-03 DIAGNOSIS — I10 Essential (primary) hypertension: Secondary | ICD-10-CM

## 2016-04-03 DIAGNOSIS — E785 Hyperlipidemia, unspecified: Secondary | ICD-10-CM

## 2016-04-03 DIAGNOSIS — E669 Obesity, unspecified: Secondary | ICD-10-CM

## 2016-04-03 NOTE — Progress Notes (Signed)
BP 130/70 (BP Location: Left Arm, Patient Position: Sitting, Cuff Size: Normal)   Pulse (!) 56   Temp 97.7 F (36.5 C)   Ht 5\' 8"  (1.727 m)   Wt 229 lb 12.8 oz (104.2 kg)   SpO2 96%   BMI 34.94 kg/m    Subjective:    Patient ID: Mark Walter, male    DOB: 1956/08/05, 60 y.o.   MRN: [id number]  HPI: Mark Walter is a 60 y.o. male presenting on 04/03/2016 for Hypertension and Hyperlipidemia   HPI   Pt didn't get his blood drawn.  Pt states mobic works well for his pain.  Pt says he is feeling well  Relevant past medical, surgical, family and social history reviewed and updated as indicated. Interim medical history since our last visit reviewed. Allergies and medications reviewed and updated.  Current Outpatient Prescriptions:  .  aspirin EC 81 MG tablet, Take 1 tablet (81 mg total) by mouth daily., Disp: , Rfl:  .  atorvastatin (LIPITOR) 20 MG tablet, Take 1 tablet (20 mg total) by mouth daily., Disp: 90 tablet, Rfl: 1 .  meloxicam (MOBIC) 15 MG tablet, Take 1 tablet (15 mg total) by mouth daily., Disp: 30 tablet, Rfl: 0 .  metoprolol (LOPRESSOR) 50 MG tablet, Take 1 tablet (50 mg total) by mouth 2 (two) times daily. (Patient taking differently: Take 50 mg by mouth daily. ), Disp: 180 tablet, Rfl: 1 .  nystatin cream (MYCOSTATIN), Apply 1 application topically 2 (two) times daily. (Patient taking differently: Apply 1 application topically every other day. ), Disp: 30 g, Rfl: 0 .  sildenafil (VIAGRA) 100 MG tablet, Take 0.5-1 tablets (50-100 mg total) by mouth daily as needed for erectile dysfunction., Disp: 10 tablet, Rfl: 2  Review of Systems  Constitutional: Negative for appetite change, chills, diaphoresis, fatigue, fever and unexpected weight change.  HENT: Negative for congestion, dental problem, drooling, ear pain, facial swelling, hearing loss, mouth sores, sneezing, sore throat, trouble swallowing and voice change.   Eyes: Positive for discharge. Negative for  pain, redness, itching and visual disturbance.  Respiratory: Negative for cough, choking, shortness of breath and wheezing.   Cardiovascular: Positive for leg swelling (occassionaly the L knee swells). Negative for chest pain and palpitations.  Gastrointestinal: Negative for abdominal pain, blood in stool, constipation, diarrhea and vomiting.  Endocrine: Negative for cold intolerance, heat intolerance and polydipsia.  Genitourinary: Negative for decreased urine volume, dysuria and hematuria.  Musculoskeletal: Positive for back pain. Negative for arthralgias and gait problem.  Skin: Negative for rash.  Allergic/Immunologic: Negative for environmental allergies.  Neurological: Negative for seizures, syncope, light-headedness and headaches.  Hematological: Negative for adenopathy.  Psychiatric/Behavioral: Negative for agitation, dysphoric mood and suicidal ideas. The patient is not nervous/anxious.     Per HPI unless specifically indicated above     Objective:    BP 130/70 (BP Location: Left Arm, Patient Position: Sitting, Cuff Size: Normal)   Pulse (!) 56   Temp 97.7 F (36.5 C)   Ht 5\' 8"  (1.727 m)   Wt 229 lb 12.8 oz (104.2 kg)   SpO2 96%   BMI 34.94 kg/m   Wt Readings from Last 3 Encounters:  04/03/16 229 lb 12.8 oz (104.2 kg)  03/14/16 226 lb 3.2 oz (102.6 kg)  01/03/16 222 lb 8 oz (100.9 kg)    Physical Exam  Constitutional: He is oriented to person, place, and time. He appears well-developed and well-nourished.  HENT:  Head: Normocephalic and atraumatic.  Neck: Neck supple.  Cardiovascular: Normal rate and regular rhythm.   Pulmonary/Chest: Effort normal and breath sounds normal. He has no wheezes.  Abdominal: Soft. Bowel sounds are normal. There is no hepatosplenomegaly. There is no tenderness.  Musculoskeletal: He exhibits no edema.  Lymphadenopathy:    He has no cervical adenopathy.  Neurological: He is alert and oriented to person, place, and time.  Skin: Skin is  warm and dry.  Psychiatric: He has a normal mood and affect. His behavior is normal.  Vitals reviewed.       Assessment & Plan:   Encounter Diagnoses  Name Primary?  . Essential hypertension, benign Yes  . Hyperlipidemia   . Obesity, unspecified     -pt to Get fasting labs drawn tomorrow.  Will call with results -continue current medications -f/u 3 months. RTO sooner prn

## 2016-04-04 LAB — LIPID PANEL
Cholesterol: 212 mg/dL — ABNORMAL HIGH (ref 125–200)
HDL: 54 mg/dL (ref >=40)
LDL CALC: 118 mg/dL (ref <130)
Total CHOL/HDL Ratio: 3.9 Ratio (ref <=5.0)
Triglycerides: 200 mg/dL — ABNORMAL HIGH (ref <150)
VLDL: 40 mg/dL — ABNORMAL HIGH (ref <30)

## 2016-04-04 LAB — COMPLETE METABOLIC PANEL WITH GFR
ALBUMIN: 3.7 g/dL (ref 3.6–5.1)
ALK PHOS: 60 U/L (ref 40–115)
ALT: 14 U/L (ref 9–46)
AST: 15 U/L (ref 10–35)
BUN: 15 mg/dL (ref 7–25)
CO2: 26 mmol/L (ref 20–31)
CREATININE: 0.87 mg/dL (ref 0.70–1.25)
Calcium: 9.5 mg/dL (ref 8.6–10.3)
Chloride: 106 mmol/L (ref 98–110)
GFR, Est African American: >89 mL/min (ref >=60)
GLUCOSE: 98 mg/dL (ref 65–99)
Potassium: 4.8 mmol/L (ref 3.5–5.3)
SODIUM: 140 mmol/L (ref 135–146)
Total Bilirubin: 0.4 mg/dL (ref 0.2–1.2)
Total Protein: 6.5 g/dL (ref 6.1–8.1)

## 2016-05-31 ENCOUNTER — Encounter: Payer: Self-pay | Admitting: Student

## 2016-06-26 ENCOUNTER — Other Ambulatory Visit: Payer: Self-pay

## 2016-06-26 DIAGNOSIS — I10 Essential (primary) hypertension: Secondary | ICD-10-CM

## 2016-06-26 DIAGNOSIS — E785 Hyperlipidemia, unspecified: Secondary | ICD-10-CM

## 2016-07-04 ENCOUNTER — Encounter: Payer: Self-pay | Admitting: Physician Assistant

## 2016-07-04 ENCOUNTER — Ambulatory Visit: Payer: Self-pay | Admitting: Physician Assistant

## 2016-07-04 VITALS — BP 150/62 | HR 58 | Temp 97.9°F | Ht 68.0 in | Wt 226.2 lb

## 2016-07-04 DIAGNOSIS — I10 Essential (primary) hypertension: Secondary | ICD-10-CM

## 2016-07-04 DIAGNOSIS — M25562 Pain in left knee: Secondary | ICD-10-CM

## 2016-07-04 DIAGNOSIS — Z91199 Patient's noncompliance with other medical treatment and regimen due to unspecified reason: Secondary | ICD-10-CM

## 2016-07-04 DIAGNOSIS — E785 Hyperlipidemia, unspecified: Secondary | ICD-10-CM

## 2016-07-04 DIAGNOSIS — Z9119 Patient's noncompliance with other medical treatment and regimen: Secondary | ICD-10-CM

## 2016-07-04 DIAGNOSIS — F17210 Nicotine dependence, cigarettes, uncomplicated: Secondary | ICD-10-CM

## 2016-07-04 MED ORDER — MELOXICAM 15 MG PO TABS: 15.0000 mg | ORAL_TABLET | Freq: Every day | ORAL | 0 refills | Status: DC | PRN

## 2016-07-04 NOTE — Progress Notes (Signed)
BP (!) 150/62 (BP Location: Right Arm, Patient Position: Sitting, Cuff Size: Normal)   Pulse (!) 58   Temp 97.9 F (36.6 C)   Ht 5\' 8"  (1.727 m)   Wt 226 lb 3.2 oz (102.6 kg)   SpO2 96%   BMI 34.39 kg/m    Subjective:    Patient ID: Mark Walter, male    DOB: 01/24/1956, 60 y.o.   MRN: [id number]  HPI: Mark Walter is a 60 y.o. male presenting on 07/04/2016 for Hypertension and Hyperlipidemia   HPI   Pt just went this morning to get his blood drawn.  He is feeling pretty good.  His only complaint is his knee pain that bothers him from time to time.   Pt hasn't taken his bp medications regularly for past several days.    Relevant past medical, surgical, family and social history reviewed and updated as indicated. Interim medical history since our last visit reviewed. Allergies and medications reviewed and updated.   Current Outpatient Prescriptions:  .  atorvastatin (LIPITOR) 20 MG tablet, Take 1 tablet (20 mg total) by mouth daily., Disp: 90 tablet, Rfl: 1 .  metoprolol (LOPRESSOR) 50 MG tablet, Take 1 tablet (50 mg total) by mouth 2 (two) times daily., Disp: 180 tablet, Rfl: 1 .  aspirin EC 81 MG tablet, Take 1 tablet (81 mg total) by mouth daily. (Patient not taking: Reported on 07/04/2016), Disp: , Rfl:  .  meloxicam (MOBIC) 15 MG tablet, Take 1 tablet (15 mg total) by mouth daily. (Patient not taking: Reported on 07/04/2016), Disp: 30 tablet, Rfl: 0 .  sildenafil (VIAGRA) 100 MG tablet, Take 0.5-1 tablets (50-100 mg total) by mouth daily as needed for erectile dysfunction. (Patient not taking: Reported on 07/04/2016), Disp: 10 tablet, Rfl: 2  Review of Systems  Constitutional: Negative for appetite change, chills, diaphoresis, fatigue, fever and unexpected weight change.  HENT: Negative for congestion, dental problem, drooling, ear pain, facial swelling, hearing loss, mouth sores, sneezing, sore throat, trouble swallowing and voice change.   Eyes: Negative for  pain, discharge, redness, itching and visual disturbance.  Respiratory: Negative for cough, choking, shortness of breath and wheezing.   Cardiovascular: Negative for chest pain, palpitations and leg swelling.  Gastrointestinal: Negative for abdominal pain, blood in stool, constipation, diarrhea and vomiting.  Endocrine: Negative for cold intolerance, heat intolerance and polydipsia.  Genitourinary: Negative for decreased urine volume, dysuria and hematuria.  Musculoskeletal: Positive for arthralgias. Negative for back pain and gait problem.  Skin: Negative for rash.  Allergic/Immunologic: Negative for environmental allergies.  Neurological: Negative for seizures, syncope, light-headedness and headaches.  Hematological: Negative for adenopathy.  Psychiatric/Behavioral: Negative for agitation, dysphoric mood and suicidal ideas. The patient is not nervous/anxious.     Per HPI unless specifically indicated above     Objective:    BP (!) 150/62 (BP Location: Right Arm, Patient Position: Sitting, Cuff Size: Normal)   Pulse (!) 58   Temp 97.9 F (36.6 C)   Ht 5\' 8"  (1.727 m)   Wt 226 lb 3.2 oz (102.6 kg)   SpO2 96%   BMI 34.39 kg/m   Wt Readings from Last 3 Encounters:  07/04/16 226 lb 3.2 oz (102.6 kg)  04/03/16 229 lb 12.8 oz (104.2 kg)  03/14/16 226 lb 3.2 oz (102.6 kg)    Physical Exam  Constitutional: He is oriented to person, place, and time. He appears well-developed and well-nourished.  HENT:  Head: Normocephalic and atraumatic.  Neck: Neck supple.  Cardiovascular: Normal rate and regular rhythm.   Pulmonary/Chest: Effort normal and breath sounds normal. He has no wheezes.  Abdominal: Soft. Bowel sounds are normal. There is no hepatosplenomegaly. There is no tenderness.  Musculoskeletal: He exhibits no edema.  Lymphadenopathy:    He has no cervical adenopathy.  Neurological: He is alert and oriented to person, place, and time.  Skin: Skin is warm and dry.  Psychiatric:  He has a normal mood and affect. His behavior is normal.  Vitals reviewed.       Assessment & Plan:   Encounter Diagnoses  Name Primary?  . Personal history of noncompliance with medical treatment, presenting hazards to health Yes  . Essential hypertension, benign   . Hyperlipidemia, unspecified hyperlipidemia type   . Left knee pain, unspecified chronicity   . Cigarette nicotine dependence, uncomplicated     -counseled pt at length about the need to Take his medications every day as prescribed in order to prevent harm to his body and reduce risks of bad events like stroke and heart attack. He states understanding and says he will do better -counseled pt on Smoking cessation -F/u 1 month to recheck blood pressure and review labs

## 2016-07-05 LAB — COMPLETE METABOLIC PANEL WITH GFR
ALT: 18 U/L (ref 9–46)
AST: 17 U/L (ref 10–35)
Albumin: 3.8 g/dL (ref 3.6–5.1)
Alkaline Phosphatase: 58 U/L (ref 40–115)
BUN: 13 mg/dL (ref 7–25)
CALCIUM: 9.6 mg/dL (ref 8.6–10.3)
CHLORIDE: 107 mmol/L (ref 98–110)
CO2: 24 mmol/L (ref 20–31)
CREATININE: 1.08 mg/dL (ref 0.70–1.25)
GFR, Est African American: 85 mL/min (ref >=60)
GFR, Est Non African American: 73 mL/min (ref >=60)
Glucose, Bld: 99 mg/dL (ref 65–99)
Potassium: 4.5 mmol/L (ref 3.5–5.3)
Sodium: 140 mmol/L (ref 135–146)
Total Bilirubin: 0.4 mg/dL (ref 0.2–1.2)
Total Protein: 6.5 g/dL (ref 6.1–8.1)

## 2016-07-05 LAB — LIPID PANEL
CHOLESTEROL: 178 mg/dL (ref 125–200)
HDL: 51 mg/dL (ref >=40)
LDL CALC: 104 mg/dL (ref <130)
Total CHOL/HDL Ratio: 3.5 Ratio (ref <=5.0)
Triglycerides: 115 mg/dL (ref <150)
VLDL: 23 mg/dL (ref <30)

## 2016-07-17 ENCOUNTER — Encounter: Payer: Self-pay | Admitting: Student

## 2016-08-08 ENCOUNTER — Encounter: Payer: Self-pay | Admitting: Physician Assistant

## 2016-08-08 ENCOUNTER — Ambulatory Visit: Payer: Self-pay | Admitting: Physician Assistant

## 2016-08-08 VITALS — BP 130/74 | HR 61 | Temp 97.7°F | Ht 68.0 in | Wt 236.5 lb

## 2016-08-08 DIAGNOSIS — R7303 Prediabetes: Secondary | ICD-10-CM

## 2016-08-08 DIAGNOSIS — I10 Essential (primary) hypertension: Secondary | ICD-10-CM

## 2016-08-08 DIAGNOSIS — E785 Hyperlipidemia, unspecified: Secondary | ICD-10-CM

## 2016-08-08 DIAGNOSIS — F17210 Nicotine dependence, cigarettes, uncomplicated: Secondary | ICD-10-CM

## 2016-08-08 MED ORDER — METOPROLOL TARTRATE 50 MG PO TABS: 50.0000 mg | ORAL_TABLET | Freq: Two times a day (BID) | ORAL | 2 refills | Status: DC

## 2016-08-08 MED ORDER — MELOXICAM 15 MG PO TABS: 15.0000 mg | ORAL_TABLET | Freq: Every day | ORAL | 0 refills | Status: DC | PRN

## 2016-08-08 MED ORDER — SILDENAFIL CITRATE 100 MG PO TABS: 50.0000 mg | ORAL_TABLET | Freq: Every day | ORAL | 1 refills | Status: DC | PRN

## 2016-08-08 MED ORDER — ATORVASTATIN CALCIUM 20 MG PO TABS: 20.0000 mg | ORAL_TABLET | Freq: Every day | ORAL | 2 refills | Status: DC

## 2016-08-08 NOTE — Progress Notes (Signed)
BP 130/74 (BP Location: Left Arm, Patient Position: Sitting, Cuff Size: Large)   Pulse 61   Temp 97.7 F (36.5 C) (Other (Comment))   Ht 5\' 8"  (1.727 m)   Wt 236 lb 8 oz (107.3 kg)   SpO2 95%   BMI 35.96 kg/m    Subjective:    Patient ID: Mark Walter, male    DOB: 1956/08/23, 60 y.o.   MRN: [id number]  HPI: Mark Walter is a 60 y.o. male presenting on 08/08/2016 for Hypertension   HPI   Pt feeling well today.  He is still smoking.   Relevant past medical, surgical, family and social history reviewed and updated as indicated. Interim medical history since our last visit reviewed. Allergies and medications reviewed and updated.  Current Outpatient Prescriptions:  .  aspirin EC 81 MG tablet, Take 1 tablet (81 mg total) by mouth daily., Disp: , Rfl:  .  atorvastatin (LIPITOR) 20 MG tablet, Take 1 tablet (20 mg total) by mouth daily., Disp: 90 tablet, Rfl: 1 .  meloxicam (MOBIC) 15 MG tablet, Take 1 tablet (15 mg total) by mouth daily as needed for pain., Disp: 30 tablet, Rfl: 0 .  metoprolol (LOPRESSOR) 50 MG tablet, Take 1 tablet (50 mg total) by mouth 2 (two) times daily., Disp: 180 tablet, Rfl: 1 .  sildenafil (VIAGRA) 100 MG tablet, Take 0.5-1 tablets (50-100 mg total) by mouth daily as needed for erectile dysfunction. (Patient not taking: Reported on 08/08/2016), Disp: 10 tablet, Rfl: 2   Review of Systems  Constitutional: Negative for appetite change, chills, diaphoresis, fatigue, fever and unexpected weight change.  HENT: Negative for congestion, dental problem, drooling, ear pain, facial swelling, hearing loss, mouth sores, sneezing, sore throat, trouble swallowing and voice change.   Eyes: Negative for pain, discharge, redness, itching and visual disturbance.  Respiratory: Negative for cough, choking, shortness of breath and wheezing.   Cardiovascular: Negative for chest pain, palpitations and leg swelling.  Gastrointestinal: Negative for abdominal pain, blood  in stool, constipation, diarrhea and vomiting.  Endocrine: Negative for cold intolerance, heat intolerance and polydipsia.  Genitourinary: Negative for decreased urine volume, dysuria and hematuria.  Musculoskeletal: Negative for arthralgias, back pain and gait problem.  Skin: Negative for rash.  Allergic/Immunologic: Negative for environmental allergies.  Neurological: Negative for seizures, syncope, light-headedness and headaches.  Hematological: Negative for adenopathy.  Psychiatric/Behavioral: Negative for agitation, dysphoric mood and suicidal ideas. The patient is not nervous/anxious.     Per HPI unless specifically indicated above     Objective:    BP 130/74 (BP Location: Left Arm, Patient Position: Sitting, Cuff Size: Large)   Pulse 61   Temp 97.7 F (36.5 C) (Other (Comment))   Ht 5\' 8"  (1.727 m)   Wt 236 lb 8 oz (107.3 kg)   SpO2 95%   BMI 35.96 kg/m   Wt Readings from Last 3 Encounters:  08/08/16 236 lb 8 oz (107.3 kg)  07/04/16 226 lb 3.2 oz (102.6 kg)  04/03/16 229 lb 12.8 oz (104.2 kg)    Physical Exam  Constitutional: He is oriented to person, place, and time. He appears well-developed and well-nourished.  HENT:  Head: Normocephalic and atraumatic.  Neck: Neck supple.  Cardiovascular: Normal rate and regular rhythm.   Pulmonary/Chest: Effort normal and breath sounds normal. He has no wheezes.  Abdominal: Soft. Bowel sounds are normal. There is no hepatosplenomegaly. There is no tenderness.  Musculoskeletal: He exhibits no edema.  Lymphadenopathy:    He has  no cervical adenopathy.  Neurological: He is alert and oriented to person, place, and time.  Skin: Skin is warm and dry.  Psychiatric: He has a normal mood and affect. His behavior is normal.  Vitals reviewed.   Results for orders placed or performed in visit on 06/26/16  COMPLETE METABOLIC PANEL WITH GFR  Result Value Ref Range   Sodium 140 135 - 146 mmol/L   Potassium 4.5 3.5 - 5.3 mmol/L    Chloride 107 98 - 110 mmol/L   CO2 24 20 - 31 mmol/L   Glucose, Bld 99 65 - 99 mg/dL   BUN 13 7 - 25 mg/dL   Creat 1.08 0.70 - 1.25 mg/dL   Total Bilirubin 0.4 0.2 - 1.2 mg/dL   Alkaline Phosphatase 58 40 - 115 U/L   AST 17 10 - 35 U/L   ALT 18 9 - 46 U/L   Total Protein 6.5 6.1 - 8.1 g/dL   Albumin 3.8 3.6 - 5.1 g/dL   Calcium 9.6 8.6 - 10.3 mg/dL   GFR, Est African American 85 >=60 mL/min   GFR, Est Non African American 73 >=60 mL/min  Lipid Profile  Result Value Ref Range   Cholesterol 178 125 - 200 mg/dL   Triglycerides 115 <150 mg/dL   HDL 51 >=40 mg/dL   Total CHOL/HDL Ratio 3.5 <=5.0 Ratio   VLDL 23 <30 mg/dL   LDL Cholesterol 104 <130 mg/dL      Assessment & Plan:   Encounter Diagnoses  Name Primary?  . Essential hypertension, benign Yes  . Hyperlipidemia, unspecified hyperlipidemia type   . Cigarette nicotine dependence, uncomplicated   . Prediabetes      -reviewed labs with pt -continue current medications -counseled on smoking cessation -follow up 3 months.  RTO sooner prn

## 2016-10-30 ENCOUNTER — Other Ambulatory Visit: Payer: Self-pay

## 2016-10-30 DIAGNOSIS — R7303 Prediabetes: Secondary | ICD-10-CM

## 2016-10-30 DIAGNOSIS — E785 Hyperlipidemia, unspecified: Secondary | ICD-10-CM

## 2016-10-30 DIAGNOSIS — I10 Essential (primary) hypertension: Secondary | ICD-10-CM

## 2016-11-07 ENCOUNTER — Ambulatory Visit: Payer: Self-pay | Admitting: Physician Assistant

## 2016-11-07 ENCOUNTER — Encounter: Payer: Self-pay | Admitting: Physician Assistant

## 2016-11-07 VITALS — BP 118/78 | HR 82 | Temp 97.9°F | Ht 68.0 in | Wt 234.0 lb

## 2016-11-07 DIAGNOSIS — F17210 Nicotine dependence, cigarettes, uncomplicated: Secondary | ICD-10-CM

## 2016-11-07 DIAGNOSIS — I10 Essential (primary) hypertension: Secondary | ICD-10-CM

## 2016-11-07 DIAGNOSIS — E669 Obesity, unspecified: Secondary | ICD-10-CM

## 2016-11-07 DIAGNOSIS — E785 Hyperlipidemia, unspecified: Secondary | ICD-10-CM

## 2016-11-07 DIAGNOSIS — R7303 Prediabetes: Secondary | ICD-10-CM

## 2016-11-07 DIAGNOSIS — Z6835 Body mass index (BMI) 35.0-35.9, adult: Secondary | ICD-10-CM

## 2016-11-07 LAB — HEMOGLOBIN A1C
Hgb A1c MFr Bld: 5.8 % — ABNORMAL HIGH (ref <5.7)
Mean Plasma Glucose: 120 mg/dL

## 2016-11-07 NOTE — Patient Instructions (Signed)
Get blood/labs drawn Call and let us know if you have insurance

## 2016-11-07 NOTE — Progress Notes (Signed)
BP 118/78 (BP Location: Left Arm, Patient Position: Sitting, Cuff Size: Large)   Pulse 82   Temp 97.9 F (36.6 C) (Other (Comment))   Ht 5\' 8"  (1.727 m)   Wt 234 lb (106.1 kg)   SpO2 95%   BMI 35.58 kg/m    Subjective:    Patient ID: Mark Walter, male    DOB: 05-02-1956, 61 y.o.   MRN: [id number]  HPI: Mark Walter is a 61 y.o. male presenting on 11/07/2016 for Hyperlipidemia and Hypertension   HPI   Pt didn't get labs drawn  Pt feeling well  Pt doesn't know if he has insurance.  His mother "takes care of all those things"  Relevant past medical, surgical, family and social history reviewed and updated as indicated. Interim medical history since our last visit reviewed. Allergies and medications reviewed and updated.   Current Outpatient Prescriptions:  .  aspirin EC 81 MG tablet, Take 1 tablet (81 mg total) by mouth daily., Disp: , Rfl:  .  atorvastatin (LIPITOR) 20 MG tablet, Take 1 tablet (20 mg total) by mouth daily., Disp: 90 tablet, Rfl: 2 .  meloxicam (MOBIC) 15 MG tablet, Take 1 tablet (15 mg total) by mouth daily as needed for pain., Disp: 90 tablet, Rfl: 0 .  metoprolol (LOPRESSOR) 50 MG tablet, Take 1 tablet (50 mg total) by mouth 2 (two) times daily., Disp: 180 tablet, Rfl: 2 .  sildenafil (VIAGRA) 100 MG tablet, Take 0.5-1 tablets (50-100 mg total) by mouth daily as needed for erectile dysfunction., Disp: 10 tablet, Rfl: 1   Review of Systems  Constitutional: Negative for appetite change, chills, diaphoresis, fatigue, fever and unexpected weight change.  HENT: Negative for congestion, drooling, ear pain, facial swelling, hearing loss, mouth sores, sneezing, sore throat, trouble swallowing and voice change.   Eyes: Negative for pain, discharge, redness, itching and visual disturbance.  Respiratory: Negative for cough, choking, shortness of breath and wheezing.   Cardiovascular: Negative for chest pain, palpitations and leg swelling.  Gastrointestinal:  Negative for abdominal pain, blood in stool, constipation, diarrhea and vomiting.  Endocrine: Negative for cold intolerance, heat intolerance and polydipsia.  Genitourinary: Negative for decreased urine volume, dysuria and hematuria.  Musculoskeletal: Negative for arthralgias, back pain and gait problem.  Skin: Negative for rash.  Allergic/Immunologic: Negative for environmental allergies.  Neurological: Negative for seizures, syncope, light-headedness and headaches.  Hematological: Negative for adenopathy.  Psychiatric/Behavioral: Negative for agitation, dysphoric mood and suicidal ideas. The patient is not nervous/anxious.     Per HPI unless specifically indicated above     Objective:    BP 118/78 (BP Location: Left Arm, Patient Position: Sitting, Cuff Size: Large)   Pulse 82   Temp 97.9 F (36.6 C) (Other (Comment))   Ht 5\' 8"  (1.727 m)   Wt 234 lb (106.1 kg)   SpO2 95%   BMI 35.58 kg/m   Wt Readings from Last 3 Encounters:  11/07/16 234 lb (106.1 kg)  08/08/16 236 lb 8 oz (107.3 kg)  07/04/16 226 lb 3.2 oz (102.6 kg)    Physical Exam  Constitutional: He is oriented to person, place, and time. He appears well-developed and well-nourished.  HENT:  Head: Normocephalic and atraumatic.  Neck: Neck supple.  Cardiovascular: Normal rate and regular rhythm.   Pulmonary/Chest: Effort normal and breath sounds normal. He has no wheezes.  Abdominal: Soft. Bowel sounds are normal. There is no hepatosplenomegaly. There is no tenderness.  Musculoskeletal: He exhibits no edema.  Lymphadenopathy:  He has no cervical adenopathy.  Neurological: He is alert and oriented to person, place, and time.  Skin: Skin is warm and dry.  Psychiatric: He has a normal mood and affect. His behavior is normal.  Vitals reviewed.       Assessment & Plan:    Encounter Diagnoses  Name Primary?  . Essential hypertension, benign Yes  . Hyperlipidemia, unspecified hyperlipidemia type   . Cigarette  nicotine dependence, uncomplicated   . Prediabetes   . Class 2 obesity with body mass index (BMI) of 35.0 to 35.9 in adult, unspecified obesity type, unspecified whether serious comorbidity present     -pt to Get labs drawn today when leaves office. Will call with results -counseled on Smoking cessation -pt to discuss with his mother and let us know if he has insurance -F/u 3 months. RTO sooner prn

## 2016-11-08 LAB — COMPREHENSIVE METABOLIC PANEL
ALBUMIN: 3.9 g/dL (ref 3.6–5.1)
ALT: 18 U/L (ref 9–46)
AST: 19 U/L (ref 10–35)
Alkaline Phosphatase: 64 U/L (ref 40–115)
BUN: 13 mg/dL (ref 7–25)
CO2: 26 mmol/L (ref 20–31)
CREATININE: 1.27 mg/dL — AB (ref 0.70–1.25)
Calcium: 9.2 mg/dL (ref 8.6–10.3)
Chloride: 108 mmol/L (ref 98–110)
GLUCOSE: 93 mg/dL (ref 65–99)
Potassium: 5.6 mmol/L — ABNORMAL HIGH (ref 3.5–5.3)
SODIUM: 143 mmol/L (ref 135–146)
Total Bilirubin: 0.3 mg/dL (ref 0.2–1.2)
Total Protein: 6.6 g/dL (ref 6.1–8.1)

## 2016-11-08 LAB — LIPID PANEL
CHOL/HDL RATIO: 3.8 ratio (ref <5.0)
CHOLESTEROL: 193 mg/dL (ref <200)
HDL: 51 mg/dL (ref >40)
LDL Cholesterol: 125 mg/dL — ABNORMAL HIGH (ref <100)
TRIGLYCERIDES: 87 mg/dL (ref <150)
VLDL: 17 mg/dL (ref <30)

## 2016-11-14 ENCOUNTER — Other Ambulatory Visit: Payer: Self-pay | Admitting: Physician Assistant

## 2016-11-14 DIAGNOSIS — I10 Essential (primary) hypertension: Secondary | ICD-10-CM

## 2016-11-14 DIAGNOSIS — E875 Hyperkalemia: Secondary | ICD-10-CM

## 2016-11-20 ENCOUNTER — Emergency Department (HOSPITAL_COMMUNITY): Payer: Self-pay

## 2016-11-20 ENCOUNTER — Encounter (HOSPITAL_COMMUNITY): Payer: Self-pay | Admitting: Emergency Medicine

## 2016-11-20 ENCOUNTER — Emergency Department (HOSPITAL_COMMUNITY)
Admission: EM | Admit: 2016-11-20 | Discharge: 2016-11-20 | Disposition: A | Discharge status: Home / Self Care | Payer: Self-pay | Attending: Emergency Medicine | Admitting: Emergency Medicine

## 2016-11-20 DIAGNOSIS — I10 Essential (primary) hypertension: Secondary | ICD-10-CM | POA: Insufficient documentation

## 2016-11-20 DIAGNOSIS — E790 Hyperuricemia without signs of inflammatory arthritis and tophaceous disease: Secondary | ICD-10-CM

## 2016-11-20 DIAGNOSIS — F17210 Nicotine dependence, cigarettes, uncomplicated: Secondary | ICD-10-CM | POA: Insufficient documentation

## 2016-11-20 DIAGNOSIS — Z7982 Long term (current) use of aspirin: Secondary | ICD-10-CM | POA: Insufficient documentation

## 2016-11-20 DIAGNOSIS — R8299 Other abnormal findings in urine: Secondary | ICD-10-CM | POA: Insufficient documentation

## 2016-11-20 DIAGNOSIS — M25521 Pain in right elbow: Secondary | ICD-10-CM | POA: Insufficient documentation

## 2016-11-20 DIAGNOSIS — Z79899 Other long term (current) drug therapy: Secondary | ICD-10-CM | POA: Insufficient documentation

## 2016-11-20 LAB — CBC WITH DIFFERENTIAL/PLATELET
Basophils Absolute: 0 10*3/uL (ref 0.0–0.1)
Basophils Relative: 0 %
EOS ABS: 0 10*3/uL (ref 0.0–0.7)
Eosinophils Relative: 0 %
HCT: 39.5 % (ref 39.0–52.0)
HEMOGLOBIN: 13.1 g/dL (ref 13.0–17.0)
LYMPHS ABS: 2.9 10*3/uL (ref 0.7–4.0)
Lymphocytes Relative: 28 %
MCH: 32.1 pg (ref 26.0–34.0)
MCHC: 33.2 g/dL (ref 30.0–36.0)
MCV: 96.8 fL (ref 78.0–100.0)
Monocytes Absolute: 1.1 10*3/uL — ABNORMAL HIGH (ref 0.1–1.0)
Monocytes Relative: 11 %
NEUTROS PCT: 60 %
Neutro Abs: 6.1 10*3/uL (ref 1.7–7.7)
Platelets: 243 10*3/uL (ref 150–400)
RBC: 4.08 MIL/uL — AB (ref 4.22–5.81)
RDW: 14.7 % (ref 11.5–15.5)
WBC: 10.2 10*3/uL (ref 4.0–10.5)

## 2016-11-20 LAB — BASIC METABOLIC PANEL
Anion gap: 7 (ref 5–15)
BUN: 12 mg/dL (ref 6–20)
CALCIUM: 9.3 mg/dL (ref 8.9–10.3)
CO2: 27 mmol/L (ref 22–32)
Chloride: 102 mmol/L (ref 101–111)
Creatinine, Ser: 1.05 mg/dL (ref 0.61–1.24)
GFR calc Af Amer: >60 mL/min (ref >60)
GFR calc non Af Amer: >60 mL/min (ref >60)
GLUCOSE: 94 mg/dL (ref 65–99)
POTASSIUM: 4.2 mmol/L (ref 3.5–5.1)
SODIUM: 136 mmol/L (ref 135–145)

## 2016-11-20 LAB — URIC ACID: URIC ACID, SERUM: 8.2 mg/dL — AB (ref 4.4–7.6)

## 2016-11-20 MED ORDER — IBUPROFEN 800 MG PO TABS
800.0000 mg | ORAL_TABLET | Freq: Once | ORAL | Status: AC
  Administered 2016-11-20: 800 mg via ORAL
  Filled 2016-11-20: qty 1

## 2016-11-20 MED ORDER — PREDNISONE 10 MG PO TABS: ORAL_TABLET | ORAL | 0 refills | Status: DC

## 2016-11-20 MED ORDER — HYDROCODONE-ACETAMINOPHEN 5-325 MG PO TABS: 1.0000 | ORAL_TABLET | ORAL | 0 refills | Status: DC | PRN

## 2016-11-20 NOTE — ED Notes (Signed)
ED Provider at bedside. 

## 2016-11-20 NOTE — ED Notes (Signed)
Patient decided to secure knife with Security.

## 2016-11-20 NOTE — Discharge Instructions (Signed)
I suspect your pain is due to gout, please refer to the information about this condition below.  A heating pad applied for 20 minutes several times daily may also be helpful.  You may take the hydrocodone prescribed for pain relief.  This will make you drowsy - do not drive within 4 hours of taking this medication.

## 2016-11-20 NOTE — ED Triage Notes (Signed)
Patient c/o right elbow pain. Patient states started Monday while "throwing things." Patient reports some swelling in elbow. Patient states using "muscle relaxer and ointment" with no relief.

## 2016-11-22 NOTE — ED Provider Notes (Signed)
[facility] DEPT Provider Note   CSN: [id number] Arrival date & time: 11/20/16  1053     History   Chief Complaint Chief Complaint  Patient presents with  . Elbow Pain    HPI Mark Walter is a 61 y.o. male presenting for evaluation of pain, swelling and redness of his right elbow area which started 2 days ago, but getting worse since yesterday.  He denies injury but states he spent Sunday playing video games and he frequently would throw his arm around when he was winning (demonstrates fist pump).  He denies a history of gout and has had no fevers, chills, nausea, vomiting.  He denies elbow or skin injuries/punctures and denies IVDU.  He has used a muscle rub and took a friends pain pill with minimal improvement in pain.  The history is provided by the patient.    Past Medical History:  Diagnosis Date  . 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 Hyperlipemia   . Pre-diabetes   . Tobacco abuse     Patient Active Problem List   Diagnosis Date Noted  . Erectile dysfunction 01/03/2016  . Obesity, unspecified 01/03/2016  . Arthralgia of both knees 10/09/2015  . Essential hypertension, benign 09/12/2015  . Prediabetes 09/12/2015  . Hyperlipidemia 09/12/2015  . Cigarette nicotine dependence, uncomplicated 95/05/3266  . Tinea pedis of both feet 09/12/2015  . KNEE, ARTHRITIS, DEGEN./OSTEO 06/02/2008  . JOINT EFFUSION, LEFT KNEE 06/02/2008    History reviewed. No pertinent surgical history.     Home Medications    Prior to Admission medications   Medication Sig Start Date End Date Taking? Authorizing Provider  aspirin EC 81 MG tablet Take 1 tablet (81 mg total) by mouth daily. 09/23/14   Arnoldo Lenis, MD  atorvastatin (LIPITOR) 20 MG tablet Take 1 tablet (20 mg total) by mouth daily. 08/08/16   Soyla Dryer, PA-C  HYDROcodone-acetaminophen (NORCO/VICODIN) 5-325 MG tablet Take 1 tablet by mouth every 4 (four) hours as needed. 11/20/16    Evalee Jefferson, PA-C  meloxicam (MOBIC) 15 MG tablet Take 1 tablet (15 mg total) by mouth daily as needed for pain. 08/08/16   Soyla Dryer, PA-C  metoprolol (LOPRESSOR) 50 MG tablet Take 1 tablet (50 mg total) by mouth 2 (two) times daily. 08/08/16   Soyla Dryer, PA-C  predniSONE (DELTASONE) 10 MG tablet Take 6 tablets day one, 5 tablets day two, 4 tablets day three, 3 tablets day four, 2 tablets day five, then 1 tablet day six 11/20/16   Evalee Jefferson, PA-C  sildenafil (VIAGRA) 100 MG tablet Take 0.5-1 tablets (50-100 mg total) by mouth daily as needed for erectile dysfunction. 08/08/16   Soyla Dryer, PA-C    Family History Family History  Problem Relation Age of Onset  . Cancer Father   . Diabetes Sister   . Diabetes Brother     Social History Social History  Substance Use Topics  . Smoking status: Current Some Day Smoker    Packs/day: 0.25    Years: 39.00    Types: Cigarettes    Start date: 01/04/1974  . Smokeless tobacco: Never Used     Comment: cutting down to 3-4 qd  . Alcohol use 0.0 oz/week     Comment: occ     Allergies   Pollen extract   Review of Systems Review of Systems  Constitutional: Negative for chills and fever.  Gastrointestinal: Negative for nausea and vomiting.  Musculoskeletal: Positive for arthralgias  and joint swelling. Negative for myalgias.  Skin: Positive for color change.  Neurological: Negative for weakness and numbness.     Physical Exam Updated Vital Signs BP 128/69   Pulse 68   Temp 98.5 F (36.9 C) (Oral)   Resp 18   Ht 5\' 9"  (1.753 m)   Wt 93 kg   SpO2 97%   BMI 30.27 kg/m   Physical Exam  Constitutional: He appears well-developed and well-nourished.  HENT:  Head: Atraumatic.  Neck: Normal range of motion.  Cardiovascular:  Pulses:      Radial pulses are 2+ on the right side, and 2+ on the left side.  Pulses equal bilaterally  Musculoskeletal: He exhibits tenderness.       Right elbow: He exhibits swelling.  Tenderness found. Lateral epicondyle tenderness noted.  ttp with mild edema and erythema mostly at the lateral elbow joint, erythema also involves posterior olecranon but without edema or tenderness.  Pt has fair flexion and extension without discomfort but is unable to completely flex/ext secondary to pain.   Neurological: He is alert. He has normal strength. He displays normal reflexes. No sensory deficit.  Equal grip strength  Skin: Skin is warm and dry.  Skin intact, without abrasion or punctures.  Psychiatric: He has a normal mood and affect.     ED Treatments / Results  Labs (all labs ordered are listed, but only abnormal results are displayed) Labs Reviewed  CBC WITH DIFFERENTIAL/PLATELET - Abnormal; Notable for the following:       Result Value   RBC 4.08 (*)    Monocytes Absolute 1.1 (*)    All other components within normal limits  URIC ACID - Abnormal; Notable for the following:    Uric Acid, Serum 8.2 (*)    All other components within normal limits  BASIC METABOLIC PANEL    EKG  EKG Interpretation None       Radiology No results found.  Procedures Procedures (including critical care time)  Medications Ordered in ED Medications  ibuprofen (ADVIL,MOTRIN) tablet 800 mg (800 mg Oral Given 11/20/16 1203)     Initial Impression / Assessment and Plan / ED Course  I have reviewed the triage vital signs and the nursing notes.  Pertinent labs & imaging results that were available during my care of the patient were reviewed by me and considered in my medical decision making (see chart for details).     Pt with elevated uric acid level and exam suggesting possible acute gouty flare.  Although he has reduced ROM he can range the joint without significant pain.  Prednisone, hydrocodone, warm compresses, plan f/u with pcp for a recheck.  Strict return precautions given if sx worsen.  Doubt septic joint.  Pt was seen by Dr. Alvino Chapel prior to dc.    Final Clinical  Impressions(s) / ED Diagnoses   Final diagnoses:  Elbow pain, right  Abnormal blood level of uric acid    New Prescriptions Discharge Medication List as of 11/20/2016  1:39 PM    START taking these medications   Details  HYDROcodone-acetaminophen (NORCO/VICODIN) 5-325 MG tablet Take 1 tablet by mouth every 4 (four) hours as needed., Starting Tue 11/20/2016, Print    predniSONE (DELTASONE) 10 MG tablet Take 6 tablets day one, 5 tablets day two, 4 tablets day three, 3 tablets day four, 2 tablets day five, then 1 tablet day six, Print         Evalee Jefferson, PA-C 11/22/16 1400  Davonna Belling, MD 11/24/16 [phone]

## 2017-02-06 ENCOUNTER — Other Ambulatory Visit: Payer: Self-pay | Admitting: Physician Assistant

## 2017-02-06 ENCOUNTER — Encounter: Payer: Self-pay | Admitting: Physician Assistant

## 2017-02-06 ENCOUNTER — Ambulatory Visit: Payer: Self-pay | Admitting: Physician Assistant

## 2017-02-06 VITALS — BP 128/69 | HR 73 | Temp 97.3°F | Wt 233.0 lb

## 2017-02-06 DIAGNOSIS — Z1211 Encounter for screening for malignant neoplasm of colon: Secondary | ICD-10-CM

## 2017-02-06 DIAGNOSIS — E785 Hyperlipidemia, unspecified: Secondary | ICD-10-CM

## 2017-02-06 DIAGNOSIS — F17210 Nicotine dependence, cigarettes, uncomplicated: Secondary | ICD-10-CM

## 2017-02-06 DIAGNOSIS — I10 Essential (primary) hypertension: Secondary | ICD-10-CM

## 2017-02-06 NOTE — Progress Notes (Signed)
BP 128/69 (BP Location: Right Arm, Patient Position: Sitting, Cuff Size: Normal)   Pulse 73   Temp 97.3 F (36.3 C) (Other (Comment))   Wt 233 lb (105.7 kg)   SpO2 96%   BMI 34.41 kg/m    Subjective:    Patient ID: Mark Walter, male    DOB: October 27, 1955, 61 y.o.   MRN: [id number]  HPI: Mark Walter is a 61 y.o. male presenting on 02/06/2017 for No chief complaint on file.   HPI   Pt did not talk with his mother about whether he had insurance.   He doesn't think he does.   Pt says he is doing well.  He is still smoking some  Relevant past medical, surgical, family and social history reviewed and updated as indicated. Interim medical history since our last visit reviewed. Allergies and medications reviewed and updated.   Current Outpatient Prescriptions:  .  aspirin EC 81 MG tablet, Take 1 tablet (81 mg total) by mouth daily., Disp: , Rfl:  .  atorvastatin (LIPITOR) 20 MG tablet, Take 1 tablet (20 mg total) by mouth daily., Disp: 90 tablet, Rfl: 2 .  metoprolol (LOPRESSOR) 50 MG tablet, Take 1 tablet (50 mg total) by mouth 2 (two) times daily., Disp: 180 tablet, Rfl: 2 .  sildenafil (VIAGRA) 100 MG tablet, Take 0.5-1 tablets (50-100 mg total) by mouth daily as needed for erectile dysfunction., Disp: 10 tablet, Rfl: 1 .  meloxicam (MOBIC) 15 MG tablet, Take 1 tablet (15 mg total) by mouth daily as needed for pain. (Patient not taking: Reported on 02/06/2017), Disp: 90 tablet, Rfl: 0   Review of Systems  Constitutional: Negative for appetite change, chills, diaphoresis, fatigue, fever and unexpected weight change.  HENT: Negative for congestion, dental problem, drooling, ear pain, facial swelling, hearing loss, mouth sores, sneezing, sore throat, trouble swallowing and voice change.   Eyes: Negative for pain, discharge, redness, itching and visual disturbance.  Respiratory: Negative for cough, choking, shortness of breath and wheezing.   Cardiovascular: Negative for chest  pain, palpitations and leg swelling.  Gastrointestinal: Negative for abdominal pain, blood in stool, constipation, diarrhea and vomiting.  Endocrine: Negative for cold intolerance, heat intolerance and polydipsia.  Genitourinary: Negative for decreased urine volume, dysuria and hematuria.  Musculoskeletal: Negative for arthralgias, back pain and gait problem.  Skin: Negative for rash.  Allergic/Immunologic: Negative for environmental allergies.  Neurological: Negative for seizures, syncope, light-headedness and headaches.  Hematological: Negative for adenopathy.  Psychiatric/Behavioral: Negative for agitation, dysphoric mood and suicidal ideas. The patient is not nervous/anxious.     Per HPI unless specifically indicated above     Objective:    BP 128/69 (BP Location: Right Arm, Patient Position: Sitting, Cuff Size: Normal)   Pulse 73   Temp 97.3 F (36.3 C) (Other (Comment))   Wt 233 lb (105.7 kg)   SpO2 96%   BMI 34.41 kg/m   Wt Readings from Last 3 Encounters:  02/06/17 233 lb (105.7 kg)  11/20/16 205 lb (93 kg)  11/07/16 234 lb (106.1 kg)    Physical Exam  Constitutional: He is oriented to person, place, and time. He appears well-developed and well-nourished.  HENT:  Head: Normocephalic and atraumatic.  Neck: Neck supple.  Cardiovascular: Normal rate and regular rhythm.   Pulmonary/Chest: Effort normal and breath sounds normal. He has no wheezes.  Abdominal: Soft. Bowel sounds are normal. There is no hepatosplenomegaly. There is no tenderness.  Musculoskeletal: He exhibits no edema.  Lymphadenopathy:  He has no cervical adenopathy.  Neurological: He is alert and oriented to person, place, and time.  Skin: Skin is warm and dry.  Psychiatric: He has a normal mood and affect. His behavior is normal.  Vitals reviewed.       Assessment & Plan:    Encounter Diagnoses  Name Primary?  . Essential hypertension, benign Yes  . Hyperlipidemia, unspecified  hyperlipidemia type   . Special screening for malignant neoplasms, colon   . Cigarette nicotine dependence, uncomplicated     -iFOBT given for colon cancer screening -pt to get fasting labs drawn. Will call with results -cousneled smoking cessation -F/u 3 months. RTO sooner prn

## 2017-02-08 ENCOUNTER — Other Ambulatory Visit (HOSPITAL_COMMUNITY)
Admission: RE | Admit: 2017-02-08 | Discharge: 2017-02-08 | Disposition: A | Discharge status: Home / Self Care | Payer: Self-pay | Source: Ambulatory Visit | Attending: Physician Assistant | Admitting: Physician Assistant

## 2017-02-08 LAB — LIPID PANEL
CHOL/HDL RATIO: 4.7 ratio
Cholesterol: 228 mg/dL — ABNORMAL HIGH (ref 0–200)
HDL: 49 mg/dL (ref >40)
LDL Cholesterol: 148 mg/dL — ABNORMAL HIGH (ref 0–99)
Triglycerides: 157 mg/dL — ABNORMAL HIGH (ref <150)
VLDL: 31 mg/dL (ref 0–40)

## 2017-02-08 LAB — PSA: PSA: 0.54 ng/mL (ref 0.00–4.00)

## 2017-02-08 LAB — COMPREHENSIVE METABOLIC PANEL
ALBUMIN: 3.6 g/dL (ref 3.5–5.0)
ALT: 24 U/L (ref 17–63)
AST: 23 U/L (ref 15–41)
Alkaline Phosphatase: 65 U/L (ref 38–126)
Anion gap: 6 (ref 5–15)
BUN: 13 mg/dL (ref 6–20)
CO2: 27 mmol/L (ref 22–32)
Calcium: 9.3 mg/dL (ref 8.9–10.3)
Chloride: 105 mmol/L (ref 101–111)
Creatinine, Ser: 1 mg/dL (ref 0.61–1.24)
GFR calc Af Amer: >60 mL/min (ref >60)
GFR calc non Af Amer: >60 mL/min (ref >60)
GLUCOSE: 105 mg/dL — AB (ref 65–99)
POTASSIUM: 4.5 mmol/L (ref 3.5–5.1)
Sodium: 138 mmol/L (ref 135–145)
TOTAL PROTEIN: 7.3 g/dL (ref 6.5–8.1)
Total Bilirubin: 0.6 mg/dL (ref 0.3–1.2)

## 2017-03-04 LAB — IFOBT (OCCULT BLOOD): IFOBT: NEGATIVE

## 2017-05-09 ENCOUNTER — Ambulatory Visit: Payer: Self-pay | Admitting: Physician Assistant

## 2017-05-15 ENCOUNTER — Encounter: Payer: Self-pay | Admitting: Physician Assistant

## 2017-05-17 ENCOUNTER — Emergency Department (HOSPITAL_COMMUNITY)
Admission: EM | Admit: 2017-05-17 | Discharge: 2017-05-17 | Disposition: A | Discharge status: Home / Self Care | Payer: Self-pay | Attending: Emergency Medicine | Admitting: Emergency Medicine

## 2017-05-17 ENCOUNTER — Encounter (HOSPITAL_COMMUNITY): Payer: Self-pay

## 2017-05-17 ENCOUNTER — Emergency Department (HOSPITAL_COMMUNITY): Payer: Self-pay

## 2017-05-17 DIAGNOSIS — I10 Essential (primary) hypertension: Secondary | ICD-10-CM | POA: Insufficient documentation

## 2017-05-17 DIAGNOSIS — Y999 Unspecified external cause status: Secondary | ICD-10-CM | POA: Insufficient documentation

## 2017-05-17 DIAGNOSIS — Z7982 Long term (current) use of aspirin: Secondary | ICD-10-CM | POA: Insufficient documentation

## 2017-05-17 DIAGNOSIS — F17210 Nicotine dependence, cigarettes, uncomplicated: Secondary | ICD-10-CM | POA: Insufficient documentation

## 2017-05-17 DIAGNOSIS — Z79899 Other long term (current) drug therapy: Secondary | ICD-10-CM | POA: Insufficient documentation

## 2017-05-17 DIAGNOSIS — Y939 Activity, unspecified: Secondary | ICD-10-CM | POA: Insufficient documentation

## 2017-05-17 DIAGNOSIS — W11XXXA Fall on and from ladder, initial encounter: Secondary | ICD-10-CM | POA: Insufficient documentation

## 2017-05-17 DIAGNOSIS — S52502A Unspecified fracture of the lower end of left radius, initial encounter for closed fracture: Secondary | ICD-10-CM | POA: Insufficient documentation

## 2017-05-17 DIAGNOSIS — Y929 Unspecified place or not applicable: Secondary | ICD-10-CM | POA: Insufficient documentation

## 2017-05-17 MED ORDER — OXYCODONE-ACETAMINOPHEN 5-325 MG PO TABS: 1.0000 | ORAL_TABLET | Freq: Four times a day (QID) | ORAL | 0 refills | Status: DC | PRN

## 2017-05-17 MED ORDER — IBUPROFEN 600 MG PO TABS: 600.0000 mg | ORAL_TABLET | Freq: Three times a day (TID) | ORAL | 0 refills | Status: DC

## 2017-05-17 MED ORDER — OXYCODONE-ACETAMINOPHEN 5-325 MG PO TABS
1.0000 | ORAL_TABLET | Freq: Once | ORAL | Status: AC
  Administered 2017-05-17: 1 via ORAL
  Filled 2017-05-17: qty 1

## 2017-05-17 NOTE — Discharge Instructions (Signed)
You have an impacted fracture of the left radius, a bone in your wrist. Please keep your wrist and arm elevated as much as possible. Apply ice. Please do not get your splint wet. Call Dr.Weingold on Monday morning for an office appointment as soon as possible. Use Tylenol, and/or ibuprofen for mild soreness. Use Percocet for more severe pain. Percocet may cause drowsiness, and/or constipation. Please use this medication with caution.

## 2017-05-17 NOTE — ED Provider Notes (Signed)
[facility] DEPT Provider Note   CSN: [id number] Arrival date & time: 05/17/17  1209     History   Chief Complaint Chief Complaint  Patient presents with  . Wrist Pain  . Fall    HPI Mark Walter is a 61 y.o. male.  Patient is a 61 year old male who presents to the emergency department following a fall.  The patient states that he was approximately 9 feet off the ground on a ladder. The latter began to shift, he lost control, and fell to the ground riding the ladder down. He complains of pain involving the wrist. He noted some deformity and swelling and came to the emergency department for evaluation. The patient denies hitting his head or injuring his neck or hurting his chest or pelvis or lower extremities. The patient denies being on any anticoagulation medications. His been no previous operations or procedures involving the left upper extremity. Patient is not taken any medication for this problem since the fall.      Past Medical History:  Diagnosis Date  . 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 Hyperlipemia   . Pre-diabetes   . Tobacco abuse     Patient Active Problem List   Diagnosis Date Noted  . Erectile dysfunction 01/03/2016  . Obesity, unspecified 01/03/2016  . Arthralgia of both knees 10/09/2015  . Essential hypertension, benign 09/12/2015  . Prediabetes 09/12/2015  . Hyperlipidemia 09/12/2015  . Cigarette nicotine dependence, uncomplicated 40/04/6760  . Tinea pedis of both feet 09/12/2015  . KNEE, ARTHRITIS, DEGEN./OSTEO 06/02/2008  . JOINT EFFUSION, LEFT KNEE 06/02/2008    History reviewed. No pertinent surgical history.     Home Medications    Prior to Admission medications   Medication Sig Start Date End Date Taking? Authorizing Provider  aspirin EC 81 MG tablet Take 1 tablet (81 mg total) by mouth daily. 09/23/14   Arnoldo Lenis, MD  atorvastatin (LIPITOR) 20 MG tablet Take 1 tablet (20 mg total) by  mouth daily. 08/08/16   Soyla Dryer, PA-C  meloxicam (MOBIC) 15 MG tablet Take 1 tablet (15 mg total) by mouth daily as needed for pain. Patient not taking: Reported on 02/06/2017 08/08/16   Soyla Dryer, PA-C  metoprolol (LOPRESSOR) 50 MG tablet Take 1 tablet (50 mg total) by mouth 2 (two) times daily. 08/08/16   Soyla Dryer, PA-C  sildenafil (VIAGRA) 100 MG tablet Take 0.5-1 tablets (50-100 mg total) by mouth daily as needed for erectile dysfunction. 08/08/16   Soyla Dryer, PA-C    Family History Family History  Problem Relation Age of Onset  . Cancer Father   . Diabetes Sister   . Diabetes Brother     Social History Social History  Substance Use Topics  . Smoking status: Current Some Day Smoker    Packs/day: 0.25    Years: 39.00    Types: Cigarettes    Start date: 01/04/1974  . Smokeless tobacco: Never Used  . Alcohol use 0.0 oz/week     Comment: occ     Allergies   Pollen extract   Review of Systems Review of Systems  Constitutional: Negative for activity change.       All ROS Neg except as noted in HPI  HENT: Negative for nosebleeds.   Eyes: Negative for photophobia and discharge.  Respiratory: Negative for cough, shortness of breath and wheezing.   Cardiovascular: Negative for chest pain and palpitations.  Gastrointestinal: Negative for abdominal pain and blood  in stool.  Genitourinary: Negative for dysuria, frequency and hematuria.  Musculoskeletal: Positive for arthralgias. Negative for back pain and neck pain.  Skin: Negative.   Neurological: Negative for dizziness, seizures and speech difficulty.  Psychiatric/Behavioral: Negative for confusion and hallucinations.     Physical Exam Updated Vital Signs BP 132/82 (BP Location: Right Arm)   Pulse 63   Temp 98.3 F (36.8 C) (Oral)   Resp 16   Ht 5\' 9"  (1.753 m)   Wt 93 kg (205 lb)   SpO2 98%   BMI 30.27 kg/m   Physical Exam  Constitutional: He is oriented to person, place, and  time. He appears well-developed and well-nourished.  Non-toxic appearance.  HENT:  Head: Normocephalic.  Right Ear: Tympanic membrane and external ear normal.  Left Ear: Tympanic membrane and external ear normal.  No scalpel for head hematoma.  There is a small area of swelling of the right upper lip.  Patient has a partial denture plate in place no evidence of fracture or trauma to the mouth.  Eyes: Pupils are equal, round, and reactive to light. EOM and lids are normal.  Neck: Normal range of motion. Neck supple. Carotid bruit is not present.  Cardiovascular: Normal rate, regular rhythm, normal heart sounds, intact distal pulses and normal pulses.   Pulmonary/Chest: Breath sounds normal. No respiratory distress.  There is symmetrical rise and fall of the chest. Patient speaks in complete sentences without problem.  Abdominal: Soft. Bowel sounds are normal. There is no tenderness. There is no guarding.  Musculoskeletal: Normal range of motion. He exhibits tenderness.       Left wrist: He exhibits bony tenderness and deformity.  There is good range of motion of the left shoulder, and elbow, there is deformity of the left wrist. There is pain to palpation or attempted movement of the left wrist. Radial pulses 2+. Capillary refill is less than 2 seconds. There is full range of motion of the right upper extremity. There is full range of motion of right and left lower extremity. Is no pain with movement of the pelvis.  Lymphadenopathy:       Head (right side): No submandibular adenopathy present.       Head (left side): No submandibular adenopathy present.    He has no cervical adenopathy.  Neurological: He is alert and oriented to person, place, and time. He has normal strength. No cranial nerve deficit or sensory deficit.  Skin: Skin is warm and dry.  Psychiatric: He has a normal mood and affect. His speech is normal.  Nursing note and vitals reviewed.    ED Treatments / Results   Labs (all labs ordered are listed, but only abnormal results are displayed) Labs Reviewed - No data to display  EKG  EKG Interpretation None       Radiology Dg Wrist Complete Left  Result Date: 05/17/2017 CLINICAL DATA:  Left wrist pain after falling 9-10 feet today. EXAM: LEFT WRIST - COMPLETE 3+ VIEW COMPARISON:  None. FINDINGS: Mildly comminuted, impacted fracture of the distal radial metaphysis with mild dorsal displacement and dorsal angulation of the distal fragment. The distal ulna is intact. Moderate radiocarpal joint degenerative changes are noted. IMPRESSION: 1. Mildly comminuted, impacted and angulated distal radial metaphysis fracture. 2. Moderate radiocarpal joint degenerative changes. Electronically Signed   By: Claudie Revering M.D.   On: 05/17/2017 13:56    Procedures FRACTURE CARE. Marland KitchenSplint Application Date/Time: 5/0/9326 3:01 PM Performed by: Lily Kocher Authorized by: Lily Kocher  Consent:    Consent obtained:  Verbal   Consent given by:  Patient   Risks discussed:  Numbness, pain and swelling Pre-procedure details:    Sensation:  Normal Procedure details:    Laterality:  Left   Location:  Wrist   Wrist:  L wrist   Cast type: [place].   Splint type:  Sugar tong   Supplies:  Cotton padding, Ortho-Glass and sling Post-procedure details:    Pain:  Improved   Sensation:  Normal   Patient tolerance of procedure:  Tolerated well, no immediate complications   (including critical care time)  Medications Ordered in ED Medications  oxyCODONE-acetaminophen (PERCOCET/ROXICET) 5-325 MG per tablet 1 tablet (1 tablet Oral Given 05/17/17 1331)     Initial Impression / Assessment and Plan / ED Course  I have reviewed the triage vital signs and the nursing notes.  Pertinent labs & imaging results that were available during my care of the patient were reviewed by me and considered in my medical decision making (see chart for details).      Final Clinical  Impressions(s) / ED Diagnoses MDM Vital signs within normal limits. X-ray of the wrists reveals a mildly comminuted impacted and angulated distal radial metaphysis fracture. I discussed the fracture with the patient in terms which he understands. Sugar tong splint and sling were applied. The patient will be treated with Percocet for his pain. He will follow-up with Dr. Burney Gauze for orthopedic hand evaluation. Questions were answered. Patient is in agreement with this plan.    Final diagnoses:  Closed fracture of distal end of left radius, unspecified fracture morphology, initial encounter    New Prescriptions New Prescriptions   IBUPROFEN (ADVIL,MOTRIN) 600 MG TABLET    Take 1 tablet (600 mg total) by mouth 3 (three) times daily.   OXYCODONE-ACETAMINOPHEN (PERCOCET/ROXICET) 5-325 MG TABLET    Take 1 tablet by mouth every 6 (six) hours as needed.     Lily Kocher, PA-C 05/17/17 1458    Lily Kocher, PA-C 05/17/17 1503    Noemi Chapel, MD 05/18/17 [phone]

## 2017-05-17 NOTE — ED Notes (Signed)
To x-ray

## 2017-05-17 NOTE — ED Triage Notes (Signed)
Patient reports falling from ladder while getting off a roof top approx. 9-10 feet. States he landed onto gravel. Small avulsion noted to right palm. Reports of pain and swelling to left wrist. Denies neck/back pain or loc. Patient denies any other complaints.

## 2017-05-17 NOTE — ED Notes (Signed)
Patient offered wc to car, patient refused. Patient instructed not to drink, drive or or operate heavy machinery while on Percocet. Verbalizes understanding. Patient ambulated out of department with steady gait noted. NAD noted.

## 2017-05-20 ENCOUNTER — Encounter: Payer: Self-pay | Admitting: Physician Assistant

## 2017-05-20 ENCOUNTER — Ambulatory Visit: Payer: Self-pay | Admitting: Physician Assistant

## 2017-05-20 ENCOUNTER — Other Ambulatory Visit (HOSPITAL_COMMUNITY)
Admission: RE | Admit: 2017-05-20 | Discharge: 2017-05-20 | Disposition: A | Discharge status: Home / Self Care | Payer: Self-pay | Source: Ambulatory Visit | Attending: Physician Assistant | Admitting: Physician Assistant

## 2017-05-20 VITALS — BP 130/72 | HR 58 | Temp 97.9°F | Ht 68.0 in | Wt 237.0 lb

## 2017-05-20 DIAGNOSIS — E785 Hyperlipidemia, unspecified: Secondary | ICD-10-CM | POA: Insufficient documentation

## 2017-05-20 DIAGNOSIS — I10 Essential (primary) hypertension: Secondary | ICD-10-CM

## 2017-05-20 DIAGNOSIS — S52502A Unspecified fracture of the lower end of left radius, initial encounter for closed fracture: Secondary | ICD-10-CM | POA: Insufficient documentation

## 2017-05-20 LAB — COMPREHENSIVE METABOLIC PANEL
ALT: 26 U/L (ref 17–63)
AST: 25 U/L (ref 15–41)
Albumin: 3.4 g/dL — ABNORMAL LOW (ref 3.5–5.0)
Alkaline Phosphatase: 54 U/L (ref 38–126)
Anion gap: 7 (ref 5–15)
BUN: 11 mg/dL (ref 6–20)
CO2: 25 mmol/L (ref 22–32)
CREATININE: 0.92 mg/dL (ref 0.61–1.24)
Calcium: 9 mg/dL (ref 8.9–10.3)
Chloride: 107 mmol/L (ref 101–111)
Glucose, Bld: 98 mg/dL (ref 65–99)
Potassium: 4.1 mmol/L (ref 3.5–5.1)
Sodium: 139 mmol/L (ref 135–145)
TOTAL PROTEIN: 6.6 g/dL (ref 6.5–8.1)
Total Bilirubin: 0.8 mg/dL (ref 0.3–1.2)

## 2017-05-20 LAB — LIPID PANEL
CHOL/HDL RATIO: 5.7 ratio
CHOLESTEROL: 221 mg/dL — AB (ref 0–200)
HDL: 39 mg/dL — AB (ref >40)
LDL Cholesterol: 152 mg/dL — ABNORMAL HIGH (ref 0–99)
TRIGLYCERIDES: 151 mg/dL — AB (ref <150)
VLDL: 30 mg/dL (ref 0–40)

## 2017-05-20 MED ORDER — ATORVASTATIN CALCIUM 20 MG PO TABS: 20.0000 mg | ORAL_TABLET | Freq: Every day | ORAL | 2 refills | Status: DC

## 2017-05-20 MED ORDER — SILDENAFIL CITRATE 100 MG PO TABS: 50.0000 mg | ORAL_TABLET | Freq: Every day | ORAL | 1 refills | Status: DC | PRN

## 2017-05-20 NOTE — Progress Notes (Signed)
BP 130/72 (BP Location: Right Arm, Patient Position: Sitting, Cuff Size: Normal)   Pulse (!) 58   Temp 97.9 F (36.6 C) (Other (Comment))   Ht 5\' 8"  (1.727 m)   Wt 237 lb (107.5 kg)   SpO2 94%   BMI 36.04 kg/m    Subjective:    Patient ID: Mark Walter, male    DOB: 13-Aug-1956, 61 y.o.   MRN: [id number]  HPI: Mark Walter is a 61 y.o. male presenting on 05/20/2017 for Hypertension and Hyperlipidemia   HPI   "broke bone in left wrist when fell off ladder Friday, Has appt with specialist in [place] this afternoon"  Pt says he is doing better with taking his lipitor every day.  He is not eating lowfat diet.  He eats greasy food.   Relevant past medical, surgical, family and social history reviewed and updated as indicated. Interim medical history since our last visit reviewed. Allergies and medications reviewed and updated.   Current Outpatient Prescriptions:  .  aspirin EC 81 MG tablet, Take 1 tablet (81 mg total) by mouth daily., Disp: , Rfl:  .  atorvastatin (LIPITOR) 20 MG tablet, Take 1 tablet (20 mg total) by mouth daily., Disp: 90 tablet, Rfl: 2 .  ibuprofen (ADVIL,MOTRIN) 600 MG tablet, Take 1 tablet (600 mg total) by mouth 3 (three) times daily., Disp: 30 tablet, Rfl: 0 .  metoprolol (LOPRESSOR) 50 MG tablet, Take 1 tablet (50 mg total) by mouth 2 (two) times daily., Disp: 180 tablet, Rfl: 2 .  oxyCODONE-acetaminophen (PERCOCET/ROXICET) 5-325 MG tablet, Take 1 tablet by mouth every 6 (six) hours as needed., Disp: 20 tablet, Rfl: 0 .  sildenafil (VIAGRA) 100 MG tablet, Take 0.5-1 tablets (50-100 mg total) by mouth daily as needed for erectile dysfunction., Disp: 10 tablet, Rfl: 1 .  meloxicam (MOBIC) 15 MG tablet, Take 1 tablet (15 mg total) by mouth daily as needed for pain. (Patient not taking: Reported on 02/06/2017), Disp: 90 tablet, Rfl: 0   Review of Systems  Constitutional: Negative for appetite change, chills, diaphoresis, fatigue, fever and unexpected  weight change.  HENT: Negative for congestion, dental problem, drooling, ear pain, facial swelling, hearing loss, mouth sores, sneezing, sore throat, trouble swallowing and voice change.   Eyes: Negative for pain, discharge, redness, itching and visual disturbance.  Respiratory: Negative for cough, choking, shortness of breath and wheezing.   Cardiovascular: Negative for chest pain, palpitations and leg swelling.  Gastrointestinal: Negative for abdominal pain, blood in stool, constipation, diarrhea and vomiting.  Endocrine: Negative for cold intolerance, heat intolerance and polydipsia.  Genitourinary: Negative for decreased urine volume, dysuria and hematuria.  Musculoskeletal: Negative for arthralgias, back pain and gait problem.  Skin: Negative for rash.  Allergic/Immunologic: Negative for environmental allergies.  Neurological: Negative for seizures, syncope, light-headedness and headaches.  Hematological: Negative for adenopathy.  Psychiatric/Behavioral: Negative for agitation, dysphoric mood and suicidal ideas. The patient is not nervous/anxious.     Per HPI unless specifically indicated above     Objective:    BP 130/72 (BP Location: Right Arm, Patient Position: Sitting, Cuff Size: Normal)   Pulse (!) 58   Temp 97.9 F (36.6 C) (Other (Comment))   Ht 5\' 8"  (1.727 m)   Wt 237 lb (107.5 kg)   SpO2 94%   BMI 36.04 kg/m   Wt Readings from Last 3 Encounters:  05/20/17 237 lb (107.5 kg)  05/17/17 205 lb (93 kg)  02/06/17 233 lb (105.7 kg)  Physical Exam  Constitutional: He is oriented to person, place, and time. He appears well-developed and well-nourished.  HENT:  Head: Normocephalic and atraumatic.  Neck: Neck supple.  Cardiovascular: Normal rate and regular rhythm.   Pulmonary/Chest: Effort normal and breath sounds normal. He has no wheezes.  Abdominal: Soft. Bowel sounds are normal. There is no hepatosplenomegaly. There is no tenderness.  Musculoskeletal: He exhibits  no edema.  Lymphadenopathy:    He has no cervical adenopathy.  Neurological: He is alert and oriented to person, place, and time.  Skin: Skin is warm and dry.  Psychiatric: He has a normal mood and affect. His behavior is normal.  Vitals reviewed.      Assessment & Plan:   Encounter Diagnoses  Name Primary?  . Essential hypertension, benign Yes  . Hyperlipidemia, unspecified hyperlipidemia type     -Pt to get fasting labs drawn tomorrow morning.  Will call with results -pt to see orthopedist as scheduled -pt to follow up in 4 months.  RTO sooner prn

## 2017-05-21 ENCOUNTER — Other Ambulatory Visit: Payer: Self-pay | Admitting: Orthopedic Surgery

## 2017-05-22 ENCOUNTER — Encounter (HOSPITAL_BASED_OUTPATIENT_CLINIC_OR_DEPARTMENT_OTHER)
Admission: RE | Admit: 2017-05-22 | Discharge: 2017-05-22 | Disposition: A | Discharge status: Home / Self Care | Payer: Self-pay | Source: Ambulatory Visit | Attending: Orthopedic Surgery | Admitting: Orthopedic Surgery

## 2017-05-22 ENCOUNTER — Encounter (HOSPITAL_BASED_OUTPATIENT_CLINIC_OR_DEPARTMENT_OTHER): Payer: Self-pay | Admitting: *Deleted

## 2017-05-22 NOTE — Progress Notes (Signed)
EKG reviewed by Dr. Roanna Banning, will proceed with surgery as scheduled.

## 2017-05-22 NOTE — Progress Notes (Signed)
   05/22/17 0951  OBSTRUCTIVE SLEEP APNEA  Have you ever been diagnosed with sleep apnea through a sleep study? No  Do you snore loudly (loud enough to be heard through closed doors)?  0  Do you often feel tired, fatigued, or sleepy during the daytime (such as falling asleep during driving or talking to someone)? 0  Has anyone observed you stop breathing during your sleep? 0  Do you have, or are you being treated for high blood pressure? 1  BMI more than 35 kg/m2? 1  Age > 50 (1-yes) 1  Neck circumference greater than:Male 16 inches or larger, Male 17inches or larger? 0  Male Gender (Yes=1) 1  Obstructive Sleep Apnea Score 4

## 2017-05-27 ENCOUNTER — Encounter (HOSPITAL_BASED_OUTPATIENT_CLINIC_OR_DEPARTMENT_OTHER): Payer: Self-pay | Admitting: Anesthesiology

## 2017-05-27 ENCOUNTER — Ambulatory Visit (HOSPITAL_BASED_OUTPATIENT_CLINIC_OR_DEPARTMENT_OTHER): Payer: Self-pay | Admitting: Anesthesiology

## 2017-05-27 ENCOUNTER — Ambulatory Visit (HOSPITAL_BASED_OUTPATIENT_CLINIC_OR_DEPARTMENT_OTHER)
Admission: RE | Admit: 2017-05-27 | Discharge: 2017-05-27 | Disposition: A | Discharge status: Home / Self Care | Payer: Self-pay | Source: Ambulatory Visit | Attending: Orthopedic Surgery | Admitting: Orthopedic Surgery

## 2017-05-27 ENCOUNTER — Encounter (HOSPITAL_BASED_OUTPATIENT_CLINIC_OR_DEPARTMENT_OTHER)
Admission: RE | Disposition: A | Discharge status: Home / Self Care | Payer: Self-pay | Source: Ambulatory Visit | Attending: Orthopedic Surgery

## 2017-05-27 DIAGNOSIS — I10 Essential (primary) hypertension: Secondary | ICD-10-CM | POA: Insufficient documentation

## 2017-05-27 DIAGNOSIS — R7303 Prediabetes: Secondary | ICD-10-CM | POA: Insufficient documentation

## 2017-05-27 DIAGNOSIS — W11XXXA Fall on and from ladder, initial encounter: Secondary | ICD-10-CM | POA: Insufficient documentation

## 2017-05-27 DIAGNOSIS — Z7982 Long term (current) use of aspirin: Secondary | ICD-10-CM | POA: Insufficient documentation

## 2017-05-27 DIAGNOSIS — F17210 Nicotine dependence, cigarettes, uncomplicated: Secondary | ICD-10-CM | POA: Insufficient documentation

## 2017-05-27 DIAGNOSIS — E785 Hyperlipidemia, unspecified: Secondary | ICD-10-CM | POA: Insufficient documentation

## 2017-05-27 DIAGNOSIS — G5602 Carpal tunnel syndrome, left upper limb: Secondary | ICD-10-CM | POA: Insufficient documentation

## 2017-05-27 DIAGNOSIS — Z79899 Other long term (current) drug therapy: Secondary | ICD-10-CM | POA: Insufficient documentation

## 2017-05-27 DIAGNOSIS — S52572A Other intraarticular fracture of lower end of left radius, initial encounter for closed fracture: Secondary | ICD-10-CM | POA: Insufficient documentation

## 2017-05-27 HISTORY — DX: Essential (primary) hypertension: I10

## 2017-05-27 HISTORY — PX: CARPAL TUNNEL RELEASE: SHX101

## 2017-05-27 HISTORY — DX: Unspecified fracture of the lower end of left radius, initial encounter for closed fracture: S52.502A

## 2017-05-27 HISTORY — PX: OPEN REDUCTION INTERNAL FIXATION (ORIF) DISTAL RADIAL FRACTURE: SHX5989

## 2017-05-27 SURGERY — OPEN REDUCTION INTERNAL FIXATION (ORIF) DISTAL RADIUS FRACTURE
Anesthesia: General | Site: Wrist | Laterality: Left

## 2017-05-27 MED ORDER — EPHEDRINE SULFATE-NACL 50-0.9 MG/10ML-% IV SOSY
PREFILLED_SYRINGE | INTRAVENOUS | Status: DC | PRN
  Administered 2017-05-27: 10 mg via INTRAVENOUS

## 2017-05-27 MED ORDER — ONDANSETRON HCL 4 MG/2ML IJ SOLN
INTRAMUSCULAR | Status: DC | PRN
  Administered 2017-05-27: 4 mg via INTRAVENOUS

## 2017-05-27 MED ORDER — METOPROLOL TARTRATE 50 MG PO TABS
50.0000 mg | ORAL_TABLET | Freq: Once | ORAL | Status: AC
  Administered 2017-05-27: 50 mg via ORAL

## 2017-05-27 MED ORDER — BUPIVACAINE HCL (PF) 0.5 % IJ SOLN
INTRAMUSCULAR | Status: AC
  Filled 2017-05-27: qty 30

## 2017-05-27 MED ORDER — CHLORHEXIDINE GLUCONATE 4 % EX LIQD: 60.0000 mL | Freq: Once | CUTANEOUS | Status: DC

## 2017-05-27 MED ORDER — PROMETHAZINE HCL 25 MG/ML IJ SOLN: 6.2500 mg | INTRAMUSCULAR | Status: DC | PRN

## 2017-05-27 MED ORDER — CEFAZOLIN SODIUM-DEXTROSE 2-4 GM/100ML-% IV SOLN
INTRAVENOUS | Status: AC
  Filled 2017-05-27: qty 100

## 2017-05-27 MED ORDER — EPHEDRINE 5 MG/ML INJ
INTRAVENOUS | Status: AC
  Filled 2017-05-27: qty 10

## 2017-05-27 MED ORDER — HYDROMORPHONE HCL 1 MG/ML IJ SOLN: 0.2500 mg | INTRAMUSCULAR | Status: DC | PRN

## 2017-05-27 MED ORDER — DEXAMETHASONE SODIUM PHOSPHATE 10 MG/ML IJ SOLN
INTRAMUSCULAR | Status: DC | PRN
  Administered 2017-05-27: 10 mg via INTRAVENOUS

## 2017-05-27 MED ORDER — MIDAZOLAM HCL 2 MG/2ML IJ SOLN
1.0000 mg | INTRAMUSCULAR | Status: DC | PRN
  Administered 2017-05-27: 2 mg via INTRAVENOUS

## 2017-05-27 MED ORDER — BUPIVACAINE-EPINEPHRINE (PF) 0.25% -1:200000 IJ SOLN
INTRAMUSCULAR | Status: AC
  Filled 2017-05-27: qty 30

## 2017-05-27 MED ORDER — PROPOFOL 10 MG/ML IV BOLUS
INTRAVENOUS | Status: AC
  Filled 2017-05-27: qty 20

## 2017-05-27 MED ORDER — FENTANYL CITRATE (PF) 100 MCG/2ML IJ SOLN
INTRAMUSCULAR | Status: AC
  Filled 2017-05-27: qty 2

## 2017-05-27 MED ORDER — METOPROLOL TARTRATE 25 MG PO TABS
ORAL_TABLET | ORAL | Status: AC
  Filled 2017-05-27: qty 2

## 2017-05-27 MED ORDER — MIDAZOLAM HCL 2 MG/2ML IJ SOLN
INTRAMUSCULAR | Status: AC
  Filled 2017-05-27: qty 2

## 2017-05-27 MED ORDER — SCOPOLAMINE 1 MG/3DAYS TD PT72: 1.0000 | MEDICATED_PATCH | Freq: Once | TRANSDERMAL | Status: DC | PRN

## 2017-05-27 MED ORDER — FENTANYL CITRATE (PF) 100 MCG/2ML IJ SOLN
50.0000 ug | INTRAMUSCULAR | Status: DC | PRN
  Administered 2017-05-27: 100 ug via INTRAVENOUS

## 2017-05-27 MED ORDER — ROPIVACAINE HCL 5 MG/ML IJ SOLN
INTRAMUSCULAR | Status: DC | PRN
  Administered 2017-05-27: 150 mg via PERINEURAL

## 2017-05-27 MED ORDER — OXYCODONE-ACETAMINOPHEN 5-325 MG PO TABS: 1.0000 | ORAL_TABLET | ORAL | 0 refills | Status: DC | PRN

## 2017-05-27 MED ORDER — CEFAZOLIN SODIUM-DEXTROSE 2-4 GM/100ML-% IV SOLN
2.0000 g | INTRAVENOUS | Status: AC
  Administered 2017-05-27: 2 g via INTRAVENOUS

## 2017-05-27 MED ORDER — PROPOFOL 10 MG/ML IV BOLUS
INTRAVENOUS | Status: DC | PRN
  Administered 2017-05-27: 200 mg via INTRAVENOUS

## 2017-05-27 MED ORDER — LACTATED RINGERS IV SOLN
INTRAVENOUS | Status: DC
  Administered 2017-05-27 (×2): via INTRAVENOUS

## 2017-05-27 SURGICAL SUPPLY — 75 items
APL SKNCLS STERI-STRIP NONHPOA (GAUZE/BANDAGES/DRESSINGS) ×1
BANDAGE ACE 3X5.8 VEL STRL LF (GAUZE/BANDAGES/DRESSINGS) IMPLANT
BANDAGE ACE 4X5 VEL STRL LF (GAUZE/BANDAGES/DRESSINGS) ×2 IMPLANT
BENZOIN TINCTURE PRP APPL 2/3 (GAUZE/BANDAGES/DRESSINGS) ×2 IMPLANT
BIT DRILL 2 FAST STEP (BIT) ×2 IMPLANT
BIT DRILL 2.5X4 QC (BIT) ×2 IMPLANT
BLADE MINI RND TIP GREEN BEAV (BLADE) IMPLANT
BLADE SURG 15 STRL LF DISP TIS (BLADE) ×2 IMPLANT
BLADE SURG 15 STRL SS (BLADE) ×4
BNDG CMPR 9X4 STRL LF SNTH (GAUZE/BANDAGES/DRESSINGS) ×1
BNDG ESMARK 4X9 LF (GAUZE/BANDAGES/DRESSINGS) ×2 IMPLANT
BNDG GAUZE ELAST 4 BULKY (GAUZE/BANDAGES/DRESSINGS) ×2 IMPLANT
CANISTER SUCT 1200ML W/VALVE (MISCELLANEOUS) IMPLANT
CORD BIPOLAR FORCEPS 12FT (ELECTRODE) ×2 IMPLANT
COVER BACK TABLE 60X90IN (DRAPES) ×2 IMPLANT
CUFF TOURNIQUET SINGLE 18IN (TOURNIQUET CUFF) ×2 IMPLANT
DECANTER SPIKE VIAL GLASS SM (MISCELLANEOUS) IMPLANT
DRAPE EXTREMITY T 121X128X90 (DRAPE) ×2 IMPLANT
DRAPE OEC MINIVIEW 54X84 (DRAPES) ×2 IMPLANT
DRAPE SURG 17X23 STRL (DRAPES) ×2 IMPLANT
DURAPREP 26ML APPLICATOR (WOUND CARE) ×2 IMPLANT
ELECT REM PT RETURN 9FT ADLT (ELECTROSURGICAL)
ELECTRODE REM PT RTRN 9FT ADLT (ELECTROSURGICAL) IMPLANT
GAUZE SPONGE 4X4 12PLY STRL (GAUZE/BANDAGES/DRESSINGS) ×2 IMPLANT
GAUZE SPONGE 4X4 16PLY XRAY LF (GAUZE/BANDAGES/DRESSINGS) IMPLANT
GAUZE XEROFORM 1X8 LF (GAUZE/BANDAGES/DRESSINGS) IMPLANT
GLOVE BIO SURGEON STRL SZ 6.5 (GLOVE) ×4 IMPLANT
GLOVE SURG SYN 8.0 (GLOVE) ×4 IMPLANT
GOWN STRL REUS W/ TWL LRG LVL3 (GOWN DISPOSABLE) ×1 IMPLANT
GOWN STRL REUS W/TWL LRG LVL3 (GOWN DISPOSABLE) ×2
GOWN STRL REUS W/TWL XL LVL3 (GOWN DISPOSABLE) ×4 IMPLANT
NEEDLE HYPO 25X1 1.5 SAFETY (NEEDLE) ×2 IMPLANT
NS IRRIG 1000ML POUR BTL (IV SOLUTION) ×2 IMPLANT
PACK BASIN DAY SURGERY FS (CUSTOM PROCEDURE TRAY) ×2 IMPLANT
PAD CAST 3X4 CTTN HI CHSV (CAST SUPPLIES) ×1 IMPLANT
PAD CAST 4YDX4 CTTN HI CHSV (CAST SUPPLIES) IMPLANT
PADDING CAST ABS 4INX4YD NS (CAST SUPPLIES) ×1
PADDING CAST ABS COTTON 4X4 ST (CAST SUPPLIES) ×1 IMPLANT
PADDING CAST COTTON 3X4 STRL (CAST SUPPLIES) ×2
PADDING CAST COTTON 4X4 STRL (CAST SUPPLIES)
PEG SUBCHONDRAL SMOOTH 2.0X24 (Peg) ×8 IMPLANT
PEG SUBCHONDRAL SMOOTH 2.0X26 (Peg) ×10 IMPLANT
PENCIL BUTTON HOLSTER BLD 10FT (ELECTRODE) IMPLANT
PLATE WIDE 28.2X62.6 LT (Plate) ×2 IMPLANT
SCREW BN 12X3.5XNS CORT TI (Screw) ×1 IMPLANT
SCREW CORT 3.5X12 (Screw) ×2 IMPLANT
SCREW CORT 3.5X15 (Screw) ×2 IMPLANT
SCREW CORT 3.5X16 LNG (Screw) ×2 IMPLANT
SHEET MEDIUM DRAPE 40X70 STRL (DRAPES) ×2 IMPLANT
SLEEVE SCD COMPRESS KNEE MED (MISCELLANEOUS) ×2 IMPLANT
SLING ARM FOAM STRAP LRG (SOFTGOODS) ×2 IMPLANT
SPLINT PLASTER CAST XFAST 3X15 (CAST SUPPLIES) IMPLANT
SPLINT PLASTER CAST XFAST 4X15 (CAST SUPPLIES) ×15 IMPLANT
SPLINT PLASTER XTRA FAST SET 4 (CAST SUPPLIES) ×15
SPLINT PLASTER XTRA FASTSET 3X (CAST SUPPLIES)
STOCKINETTE 4X48 STRL (DRAPES) ×2 IMPLANT
STRIP CLOSURE SKIN 1/2X4 (GAUZE/BANDAGES/DRESSINGS) ×2 IMPLANT
SUCTION FRAZIER HANDLE 10FR (MISCELLANEOUS)
SUCTION TUBE FRAZIER 10FR DISP (MISCELLANEOUS) IMPLANT
SUT CHROMIC 3 0 PS 2 (SUTURE) IMPLANT
SUT ETHILON 4 0 PS 2 18 (SUTURE) IMPLANT
SUT MERSILENE 4 0 P 3 (SUTURE) IMPLANT
SUT PROLENE 3 0 PS 2 (SUTURE) ×2 IMPLANT
SUT VIC AB 0 SH 27 (SUTURE) IMPLANT
SUT VIC AB 2-0 SH 27 (SUTURE)
SUT VIC AB 2-0 SH 27XBRD (SUTURE) IMPLANT
SUT VIC AB 3-0 FS2 27 (SUTURE) IMPLANT
SUT VIC AB 4-0 RB1 18 (SUTURE) IMPLANT
SUT VICRYL RAPIDE 4-0 (SUTURE) IMPLANT
SUT VICRYL RAPIDE 4/0 PS 2 (SUTURE) IMPLANT
SYR 10ML LL (SYRINGE) ×2 IMPLANT
SYR BULB 3OZ (MISCELLANEOUS) ×2 IMPLANT
TOWEL OR 17X24 6PK STRL BLUE (TOWEL DISPOSABLE) ×2 IMPLANT
TUBE CONNECTING 20X1/4 (TUBING) IMPLANT
UNDERPAD 30X30 (UNDERPADS AND DIAPERS) ×2 IMPLANT

## 2017-05-27 NOTE — H&P (Signed)
Mark Walter is an 61 y.o. male.   Chief Complaint: Left wrist pain and deformity HPI: Patient's a very pleasant 61 year old male status post fall off a ladder last week with a displaced intra-articular fracture left distal radius  Past Medical History:  Diagnosis Date  . Brain injury [facility]) age 31   states was unconscious for 3 days  . Closed fracture of left distal radius   . Hyperlipemia   . Hypertension   . Pre-diabetes   . Tobacco abuse     Past Surgical History:  Procedure Laterality Date  . NO PAST SURGERIES      Family History  Problem Relation Age of Onset  . Cancer Father   . Diabetes Sister   . Diabetes Brother    Social History:  reports that he has been smoking Cigarettes.  He started smoking about 43 years ago. He has a 9.75 pack-year smoking history. He has never used smokeless tobacco. He reports that he drinks alcohol. He reports that he does not use drugs.  Allergies:  Allergies  Allergen Reactions  . Pollen Extract Other (See Comments)    sneezing    Medications Prior to Admission  Medication Sig Dispense Refill  . aspirin EC 81 MG tablet Take 1 tablet (81 mg total) by mouth daily.    Marland Kitchen atorvastatin (LIPITOR) 20 MG tablet Take 1 tablet (20 mg total) by mouth daily. 90 tablet 2  . ibuprofen (ADVIL,MOTRIN) 600 MG tablet Take 1 tablet (600 mg total) by mouth 3 (three) times daily. 30 tablet 0  . metoprolol (LOPRESSOR) 50 MG tablet Take 1 tablet (50 mg total) by mouth 2 (two) times daily. 180 tablet 2  . oxyCODONE-acetaminophen (PERCOCET/ROXICET) 5-325 MG tablet Take 1 tablet by mouth every 6 (six) hours as needed. 20 tablet 0  . sildenafil (VIAGRA) 100 MG tablet Take 0.5-1 tablets (50-100 mg total) by mouth daily as needed for erectile dysfunction. 10 tablet 1    No results found for this or any previous visit (from the past 48 hour(s)). No results found.  Review of Systems  All other systems reviewed and are negative.   Blood pressure 127/89,  pulse (!) 56, temperature 97.8 F (36.6 C), temperature source Oral, resp. rate 18, height 5\' 8"  (1.727 m), weight 103 kg (227 lb), SpO2 100 %. Physical Exam  Constitutional: He is oriented to person, place, and time. He appears well-developed and well-nourished.  HENT:  Head: Normocephalic and atraumatic.  Neck: Normal range of motion.  Cardiovascular: Normal rate.   Respiratory: Effort normal.  Musculoskeletal:       Left wrist: He exhibits tenderness, bony tenderness, swelling and deformity.  Left wrist dorsal swelling with obvious deformity  Neurological: He is alert and oriented to person, place, and time.  Skin: Skin is warm.  Psychiatric: He has a normal mood and affect. His behavior is normal. Judgment and thought content normal.     Assessment/Plan As above. Plan on open reduction and internal fixation of displaced left distal radius fracture as an outpatient.  Schuyler Amor, MD 05/27/2017, 10:37 AM

## 2017-05-27 NOTE — Transfer of Care (Signed)
Immediate Anesthesia Transfer of Care Note  Patient: Mark Walter  Procedure(s) Performed: Procedure(s): OPEN REDUCTION INTERNAL FIXATION (ORIF) LEFT DISTAL RADIAL FRACTURE (Left) LEFT CARPAL TUNNEL RELEASE (Left)  Patient Location: PACU  Anesthesia Type:GA combined with regional for post-op pain  Level of Consciousness: awake, patient cooperative and lethargic  Airway & Oxygen Therapy: Patient Spontanous Breathing and Patient connected to face mask oxygen  Post-op Assessment: Report given to RN and Post -op Vital signs reviewed and stable  Post vital signs: Reviewed and stable  Last Vitals:  Vitals:   05/27/17 1047 05/27/17 1049  BP: 117/74 115/77  Pulse: (!) 57 (!) 57  Resp: 16 17  Temp:    SpO2: 99% 99%    Last Pain:  Vitals:   05/27/17 1049  TempSrc:   PainSc: 0-No pain         Complications: No apparent anesthesia complications

## 2017-05-27 NOTE — Op Note (Signed)
Please see the dictated report [phone]

## 2017-05-27 NOTE — Progress Notes (Signed)
Assisted Dr. Singer with left, ultrasound guided, supraclavicular block. Side rails up, monitors on throughout procedure. See vital signs in flow sheet. Tolerated Procedure well. 

## 2017-05-27 NOTE — Discharge Instructions (Signed)
Regional Anesthesia Blocks ? ?1. Numbness or the inability to move the "blocked" extremity may last from 3-48 hours after placement. The length of time depends on the medication injected and your individual response to the medication. If the numbness is not going away after 48 hours, call your surgeon. ? ?2. The extremity that is blocked will need to be protected until the numbness is gone and the  Strength has returned. Because you cannot feel it, you will need to take extra care to avoid injury. Because it may be weak, you may have difficulty moving it or using it. You may not know what position it is in without looking at it while the block is in effect. ? ?3. For blocks in the legs and feet, returning to weight bearing and walking needs to be done carefully. You will need to wait until the numbness is entirely gone and the strength has returned. You should be able to move your leg and foot normally before you try and bear weight or walk. You will need someone to be with you when you first try to ensure you do not fall and possibly risk injury. ? ?4. Bruising and tenderness at the needle site are common side effects and will resolve in a few days. ? ?5. Persistent numbness or new problems with movement should be communicated to the surgeon or the [facility] ([phone])/ [facility] ([phone]).  ? ?Post Anesthesia Home Care Instructions ? ?Activity: ?Get plenty of rest for the remainder of the day. A responsible individual must stay with you for 24 hours following the procedure.  ?For the next 24 hours, DO NOT: ?-Drive a car ?-Operate machinery ?-Drink alcoholic beverages ?-Take any medication unless instructed by your physician ?-Make any legal decisions or sign important papers. ? ?Meals: ?Start with liquid foods such as gelatin or soup. Progress to regular foods as tolerated. Avoid greasy, spicy, heavy foods. If nausea and/or vomiting occur, drink only clear liquids until the  nausea and/or vomiting subsides. Call your physician if vomiting continues. ? ?Special Instructions/Symptoms: ?Your throat may feel dry or sore from the anesthesia or the breathing tube placed in your throat during surgery. If this causes discomfort, gargle with warm salt water. The discomfort should disappear within 24 hours. ? ?If you had a scopolamine patch placed behind your ear for the management of post- operative nausea and/or vomiting: ? ?1. The medication in the patch is effective for 72 hours, after which it should be removed.  Wrap patch in a tissue and discard in the trash. Wash hands thoroughly with soap and water. ?2. You may remove the patch earlier than 72 hours if you experience unpleasant side effects which may include dry mouth, dizziness or visual disturbances. ?3. Avoid touching the patch. Wash your hands with soap and water after contact with the patch. ?    ?

## 2017-05-27 NOTE — Anesthesia Procedure Notes (Signed)
Anesthesia Regional Block: Supraclavicular block   Pre-Anesthetic Checklist: ,, timeout performed, Correct Patient, Correct Site, Correct Laterality, Correct Procedure,, site marked, risks and benefits discussed, Surgical consent,  Pre-op evaluation,  At surgeon's request and post-op pain management  Laterality: Left  Prep: chloraprep       Needles:  Injection technique: Single-shot  Needle Type: Echogenic Stimulator Needle     Needle Length: 5cm  Needle Gauge: 22     Additional Needles:   Procedures:, nerve stimulator,,,, intact distal pulses,,,   Nerve Stimulator or Paresthesia:  Response: bicep contraction, 0.48 mA,   Additional Responses:   Narrative:  Start time: 05/27/2017 10:37 AM End time: 05/27/2017 10:47 AM Injection made incrementally with aspirations every 5 mL.  Performed by: Personally   Additional Notes: Functioning IV was confirmed and monitors applied.  A 58mm 22ga echogenic arrow stimulator was used. Sterile prep and drape,hand hygiene and sterile gloves were used.Ultrasound guidance: relevent anatomy identified, needle position confirmed, local anesthetic spread visualized around nerve(s)., vascular puncture avoided.  Image printed for medical record.  Negative aspiration and negative test dose prior to incremental administration of local anesthetic. The patient tolerated the procedure well.

## 2017-05-27 NOTE — Anesthesia Procedure Notes (Signed)
Procedure Name: LMA Insertion Date/Time: 05/27/2017 11:04 AM Performed by: Maryella Shivers Pre-anesthesia Checklist: Patient identified, Emergency Drugs available, Suction available and Patient being monitored Patient Re-evaluated:Patient Re-evaluated prior to induction Oxygen Delivery Method: Circle system utilized Preoxygenation: Pre-oxygenation with 100% oxygen Induction Type: IV induction Ventilation: Mask ventilation without difficulty LMA: LMA inserted LMA Size: 5.0 Number of attempts: 1 Airway Equipment and Method: Bite block Placement Confirmation: positive ETCO2 Tube secured with: Tape Dental Injury: Teeth and Oropharynx as per pre-operative assessment

## 2017-05-27 NOTE — Anesthesia Postprocedure Evaluation (Signed)
Anesthesia Post Note  Patient: Mark Walter  Procedure(s) Performed: Procedure(s) (LRB): OPEN REDUCTION INTERNAL FIXATION (ORIF) LEFT DISTAL RADIAL FRACTURE (Left) LEFT CARPAL TUNNEL RELEASE (Left)     Patient location during evaluation: PACU Anesthesia Type: General Level of consciousness: sedated Pain management: pain level controlled Vital Signs Assessment: post-procedure vital signs reviewed and stable Respiratory status: spontaneous breathing and respiratory function stable Cardiovascular status: stable Postop Assessment: no apparent nausea or vomiting Anesthetic complications: no    Last Vitals:  Vitals:   05/27/17 1215 05/27/17 1230  BP: 117/75 116/77  Pulse: (!) 53 (!) 53  Resp: (!) 21 (!) 21  Temp:    SpO2: 100% 96%    Last Pain:  Vitals:   05/27/17 1230  TempSrc:   PainSc: 0-No pain                 Finis Hendricksen DANIEL

## 2017-05-27 NOTE — Anesthesia Preprocedure Evaluation (Addendum)
Anesthesia Evaluation  Patient identified by MRN, date of birth, ID band Patient awake    Reviewed: Allergy & Precautions, NPO status , Patient's Chart, lab work & pertinent test results  History of Anesthesia Complications Negative for: history of anesthetic complications  Airway Mallampati: II  TM Distance: >3 FB Neck ROM: Full    Dental  (+) Dental Advisory Given, Partial Upper   Pulmonary Current Smoker,    Pulmonary exam normal        Cardiovascular hypertension, Pt. on medications and Pt. on home beta blockers negative cardio ROS Normal cardiovascular exam     Neuro/Psych negative neurological ROS  negative psych ROS   GI/Hepatic negative GI ROS, Neg liver ROS,   Endo/Other  negative endocrine ROS  Renal/GU negative Renal ROS  negative genitourinary   Musculoskeletal negative musculoskeletal ROS (+)   Abdominal   Peds negative pediatric ROS (+)  Hematology negative hematology ROS (+)   Anesthesia Other Findings Day of surgery medications reviewed with the patient.  Reproductive/Obstetrics negative OB ROS                            Anesthesia Physical Anesthesia Plan  ASA: II  Anesthesia Plan: General   Post-op Pain Management: GA combined w/ Regional for post-op pain   Induction: Intravenous  PONV Risk Score and Plan: 1 and Ondansetron and Dexamethasone  Airway Management Planned: LMA  Additional Equipment:   Intra-op Plan:   Post-operative Plan: Extubation in OR  Informed Consent: I have reviewed the patients History and Physical, chart, labs and discussed the procedure including the risks, benefits and alternatives for the proposed anesthesia with the patient or authorized representative who has indicated his/her understanding and acceptance.   Dental advisory given  Plan Discussed with: CRNA, Anesthesiologist and Surgeon  Anesthesia Plan Comments:         Anesthesia Quick Evaluation

## 2017-05-28 ENCOUNTER — Encounter (HOSPITAL_BASED_OUTPATIENT_CLINIC_OR_DEPARTMENT_OTHER): Payer: Self-pay | Admitting: Orthopedic Surgery

## 2017-05-28 NOTE — Op Note (Signed)
Mark Walter, Mark Walter              ACCOUNT NO.:  [id number]  MEDICAL RECORD NO.:  [id number]  LOCATION:                                 FACILITY:  PHYSICIAN:  Sheral Apley. Burney Gauze, M.D.   DATE OF BIRTH:  DATE OF PROCEDURE:  05/27/2017 DATE OF DISCHARGE:                              OPERATIVE REPORT   PREOPERATIVE DIAGNOSIS:  Displaced intra-articular fracture, distal radius, left side, with left carpal tunnel syndrome.  POSTOPERATIVE DIAGNOSIS:  Displaced intra-articular fracture, distal radius, left side, with left carpal tunnel syndrome.  PROCEDURES:  Open reduction and internal fixation of above with wide DVR plate and screw and carpal tunnel release.  SURGEON:  Sheral Apley. Burney Gauze, M.D.  ASSISTANT:  None.  ANESTHESIA:  Ax block and general.  COMPLICATIONS:  None.  DRAINS:  None.  DESCRIPTION OF PROCEDURE:  The patient was taken to the operating suite and after induction of adequate general anesthetic and supraclavicular block analgesia, the left upper extremity was prepped and draped in the usual sterile fashion.  An Esmarch was used to exsanguinate the limb. Tourniquet was then inflated to 250 mmHg.  At this point in time, an incision was made on the palmar aspect of the left hand and wrist over the FCR tendon.  The skin was incised sharply.  Dissection was carried down the interval between the flexor carpi radialis tendon and the radial artery.  The radial artery was retracted to the lateral side of the FCR on the midline.  The fascia was incised.  Dissection was carried down to the level of the pronator quadratus.  The pronator quadratus was subperiosteally stripped off the distal radius revealing the fracture site.  The fracture site was identified with a subperiosteal dissection and release of the brachioradialis off the radial styloid tip. Reduction was then performed with flexion, ulnar deviation, and slight traction.  We placed a standard DVR plate on the  volar aspect of the distal radius with fluoroscopy to determine the plate to be too narrow. We then placed a wide plate, fixed it to the slotted hole with a cortical screw.  Fluoroscopy was then used to determine adequate plate position followed by 2 more cortical screws proximally and smooth pegs distally.  Fluoroscopic imaging then revealed adequate reduction in AP, lateral, and oblique views.  We then identified the median nerve in the proximal aspect of the wound and traced it to the proximal edge of the transverse carpal ligament.  We carefully dissected dorsal and volar to the transverse carpal ligament and created a path both dorsally and volarly.  Then, using a Soil scientist to protect the median nerve, we released the transverse carpal ligament from proximal to distal under direct vision.  The wound was thoroughly irrigated.  We loosely closed in layers of 2-0 undyed Vicryl to cover the plate and a 3-0 Prolene subcuticular stitch on the skin.  Steri- Strips, 4 x 4's, fluffs, and a compressive dressing and volar splint were applied.  The patient tolerated this procedure well and went to the recovery room in stable fashion.     Sheral Apley Burney Gauze, M.D.   ______________________________ Sheral Apley. Burney Gauze, M.D.    MAW/MEDQ  D:  05/27/2017  T:  05/28/2017  Job:  [id number]

## 2017-09-19 ENCOUNTER — Ambulatory Visit: Payer: Self-pay | Admitting: Physician Assistant

## 2017-09-30 ENCOUNTER — Encounter: Payer: Self-pay | Admitting: Physician Assistant

## 2017-10-08 ENCOUNTER — Ambulatory Visit: Payer: Self-pay | Admitting: Physician Assistant

## 2017-10-08 ENCOUNTER — Encounter: Payer: Self-pay | Admitting: Physician Assistant

## 2017-10-08 VITALS — BP 136/78 | HR 57 | Temp 97.0°F | Ht 68.0 in | Wt 237.0 lb

## 2017-10-08 DIAGNOSIS — E669 Obesity, unspecified: Secondary | ICD-10-CM

## 2017-10-08 DIAGNOSIS — R7303 Prediabetes: Secondary | ICD-10-CM

## 2017-10-08 DIAGNOSIS — I10 Essential (primary) hypertension: Secondary | ICD-10-CM

## 2017-10-08 DIAGNOSIS — F17210 Nicotine dependence, cigarettes, uncomplicated: Secondary | ICD-10-CM

## 2017-10-08 DIAGNOSIS — E785 Hyperlipidemia, unspecified: Secondary | ICD-10-CM

## 2017-10-08 NOTE — Progress Notes (Signed)
BP 136/78 (BP Location: Right Arm, Patient Position: Sitting, Cuff Size: Large)   Pulse (!) 57   Temp (!) 97 F (36.1 C) (Other (Comment))   Ht 5\' 8"  (1.727 m)   Wt 237 lb (107.5 kg)   SpO2 96%   BMI 36.04 kg/m    Subjective:    Patient ID: Mark Walter, male    DOB: Jan 30, 1956, 62 y.o.   MRN: [id number]  HPI: Mark Walter is a 62 y.o. male presenting on 10/08/2017 for Hyperlipidemia and Hypertension   HPI   Pt is doing well.    His wrist is healed up after ORIF surgery.   Relevant past medical, surgical, family and social history reviewed and updated as indicated. Interim medical history since our last visit reviewed. Allergies and medications reviewed and updated.   Current Outpatient Medications:  .  atorvastatin (LIPITOR) 20 MG tablet, Take 1 tablet (20 mg total) by mouth daily., Disp: 90 tablet, Rfl: 2 .  metoprolol (LOPRESSOR) 50 MG tablet, Take 1 tablet (50 mg total) by mouth 2 (two) times daily., Disp: 180 tablet, Rfl: 2 .  sildenafil (VIAGRA) 100 MG tablet, Take 0.5-1 tablets (50-100 mg total) by mouth daily as needed for erectile dysfunction., Disp: 10 tablet, Rfl: 1   Review of Systems  Constitutional: Negative for appetite change, chills, diaphoresis, fatigue, fever and unexpected weight change.  HENT: Negative for congestion, dental problem, drooling, ear pain, facial swelling, hearing loss, mouth sores, sneezing, sore throat, trouble swallowing and voice change.   Eyes: Negative for pain, discharge, redness, itching and visual disturbance.  Respiratory: Negative for cough, choking, shortness of breath and wheezing.   Cardiovascular: Negative for chest pain, palpitations and leg swelling.  Gastrointestinal: Negative for abdominal pain, blood in stool, constipation, diarrhea and vomiting.  Endocrine: Negative for cold intolerance, heat intolerance and polydipsia.  Genitourinary: Negative for decreased urine volume, dysuria and hematuria.   Musculoskeletal: Negative for arthralgias, back pain and gait problem.  Skin: Negative for rash.  Allergic/Immunologic: Negative for environmental allergies.  Neurological: Negative for seizures, syncope, light-headedness and headaches.  Hematological: Negative for adenopathy.  Psychiatric/Behavioral: Negative for agitation, dysphoric mood and suicidal ideas. The patient is not nervous/anxious.     Per HPI unless specifically indicated above     Objective:    BP 136/78 (BP Location: Right Arm, Patient Position: Sitting, Cuff Size: Large)   Pulse (!) 57   Temp (!) 97 F (36.1 C) (Other (Comment))   Ht 5\' 8"  (1.727 m)   Wt 237 lb (107.5 kg)   SpO2 96%   BMI 36.04 kg/m   Wt Readings from Last 3 Encounters:  10/08/17 237 lb (107.5 kg)  05/27/17 227 lb (103 kg)  05/20/17 237 lb (107.5 kg)    Physical Exam  Constitutional: He is oriented to person, place, and time. He appears well-developed and well-nourished.  HENT:  Head: Normocephalic and atraumatic.  Neck: Neck supple.  Cardiovascular: Normal rate and regular rhythm.  Pulmonary/Chest: Effort normal and breath sounds normal. He has no wheezes.  Abdominal: Soft. Bowel sounds are normal. There is no hepatosplenomegaly. There is no tenderness.  Musculoskeletal: He exhibits no edema.  Lymphadenopathy:    He has no cervical adenopathy.  Neurological: He is alert and oriented to person, place, and time.  Skin: Skin is warm and dry.  Psychiatric: He has a normal mood and affect. His behavior is normal.  Vitals reviewed.       Assessment & Plan:  Encounter Diagnoses  Name Primary?  . Essential hypertension, benign Yes  . Hyperlipidemia, unspecified hyperlipidemia type   . Cigarette nicotine dependence, uncomplicated   . Prediabetes   . Obesity, unspecified classification, unspecified obesity type, unspecified whether serious comorbidity present     -Check fasting labs.  We will call pt with results -pt to continue  current medications -pt to follow up 4 months.  RTO sooner prn

## 2017-10-10 ENCOUNTER — Other Ambulatory Visit: Payer: Self-pay | Admitting: Physician Assistant

## 2017-10-10 ENCOUNTER — Other Ambulatory Visit (HOSPITAL_COMMUNITY)
Admission: RE | Admit: 2017-10-10 | Discharge: 2017-10-10 | Disposition: A | Discharge status: Home / Self Care | Payer: Self-pay | Source: Ambulatory Visit | Attending: Physician Assistant | Admitting: Physician Assistant

## 2017-10-10 DIAGNOSIS — E785 Hyperlipidemia, unspecified: Secondary | ICD-10-CM

## 2017-10-10 DIAGNOSIS — R7303 Prediabetes: Secondary | ICD-10-CM | POA: Insufficient documentation

## 2017-10-10 DIAGNOSIS — Z125 Encounter for screening for malignant neoplasm of prostate: Secondary | ICD-10-CM

## 2017-10-10 DIAGNOSIS — I10 Essential (primary) hypertension: Secondary | ICD-10-CM | POA: Insufficient documentation

## 2017-10-10 LAB — COMPREHENSIVE METABOLIC PANEL
ALBUMIN: 3.8 g/dL (ref 3.5–5.0)
ALK PHOS: 65 U/L (ref 38–126)
ALT: 26 U/L (ref 17–63)
ANION GAP: 8 (ref 5–15)
AST: 25 U/L (ref 15–41)
BUN: 9 mg/dL (ref 6–20)
CO2: 24 mmol/L (ref 22–32)
CREATININE: 1.03 mg/dL (ref 0.61–1.24)
Calcium: 9.2 mg/dL (ref 8.9–10.3)
Chloride: 107 mmol/L (ref 101–111)
GFR calc Af Amer: >60 mL/min (ref >60)
GFR calc non Af Amer: >60 mL/min (ref >60)
GLUCOSE: 102 mg/dL — AB (ref 65–99)
Potassium: 4.4 mmol/L (ref 3.5–5.1)
Sodium: 139 mmol/L (ref 135–145)
Total Bilirubin: 0.6 mg/dL (ref 0.3–1.2)
Total Protein: 7.3 g/dL (ref 6.5–8.1)

## 2017-10-10 LAB — LIPID PANEL
CHOL/HDL RATIO: 4.7 ratio
CHOLESTEROL: 230 mg/dL — AB (ref 0–200)
HDL: 49 mg/dL (ref >40)
LDL Cholesterol: 150 mg/dL — ABNORMAL HIGH (ref 0–99)
TRIGLYCERIDES: 153 mg/dL — AB (ref <150)
VLDL: 31 mg/dL (ref 0–40)

## 2017-10-10 LAB — HEMOGLOBIN A1C
HEMOGLOBIN A1C: 6.2 % — AB (ref 4.8–5.6)
MEAN PLASMA GLUCOSE: 131.24 mg/dL

## 2017-10-10 MED ORDER — ATORVASTATIN CALCIUM 40 MG PO TABS: 40.0000 mg | ORAL_TABLET | Freq: Every day | ORAL | 3 refills | Status: DC

## 2017-11-13 ENCOUNTER — Encounter: Payer: Self-pay | Admitting: Physician Assistant

## 2017-11-13 ENCOUNTER — Ambulatory Visit: Payer: Self-pay | Admitting: Physician Assistant

## 2017-11-13 VITALS — BP 127/73 | HR 82 | Temp 97.5°F | Ht 68.0 in | Wt 232.0 lb

## 2017-11-13 DIAGNOSIS — B353 Tinea pedis: Secondary | ICD-10-CM

## 2017-11-13 DIAGNOSIS — M2011 Hallux valgus (acquired), right foot: Secondary | ICD-10-CM | POA: Insufficient documentation

## 2017-11-13 NOTE — Progress Notes (Signed)
BP 127/73 (BP Location: Right Arm, Patient Position: Sitting, Cuff Size: Normal)   Pulse 82   Temp (!) 97.5 F (36.4 C)   Ht 5\' 8"  (1.727 m)   Wt 232 lb (105.2 kg)   SpO2 97%   BMI 35.28 kg/m    Subjective:    Patient ID: [name], male    DOB: 1956-04-02, 62 y.o.   MRN: [id number]  HPI: Mark Walter is a 62 y.o. male presenting on 11/13/2017 for Foot Problem (r foot. sx began 3-4 days ago. pt states it was itching and he started scratching and thinks he got it infected. pt states it is now an open sore. pt states he used neosporin, antibiotic cream.)   HPI  Chief Complaint  Patient presents with  . Foot Problem    r foot. sx began 3-4 days ago. pt states it was itching and he started scratching and thinks he got it infected. pt states it is now an open sore. pt states he used neosporin, antibiotic cream.    Relevant past medical, surgical, family and social history reviewed and updated as indicated. Interim medical history since our last visit reviewed. Allergies and medications reviewed and updated.   Current Outpatient Medications:  .  aspirin EC 81 MG tablet, Take 81 mg by mouth daily., Disp: , Rfl:  .  atorvastatin (LIPITOR) 40 MG tablet, Take 1 tablet (40 mg total) by mouth daily., Disp: 90 tablet, Rfl: 3 .  metoprolol (LOPRESSOR) 50 MG tablet, Take 1 tablet (50 mg total) by mouth 2 (two) times daily., Disp: 180 tablet, Rfl: 2 .  sildenafil (VIAGRA) 100 MG tablet, Take 0.5-1 tablets (50-100 mg total) by mouth daily as needed for erectile dysfunction., Disp: 10 tablet, Rfl: 1   Review of Systems  Constitutional: Negative for appetite change, chills, diaphoresis, fatigue, fever and unexpected weight change.  HENT: Negative for congestion, dental problem, drooling, ear pain, facial swelling, hearing loss, mouth sores, sneezing, sore throat, trouble swallowing and voice change.   Eyes: Negative for pain, discharge, redness, itching and visual disturbance.   Respiratory: Negative for cough, choking, shortness of breath and wheezing.   Cardiovascular: Negative for chest pain, palpitations and leg swelling.  Gastrointestinal: Negative for abdominal pain, blood in stool, constipation, diarrhea and vomiting.  Endocrine: Negative for cold intolerance, heat intolerance and polydipsia.  Genitourinary: Negative for decreased urine volume, dysuria and hematuria.  Musculoskeletal: Negative for arthralgias, back pain and gait problem.  Skin: Positive for rash.  Allergic/Immunologic: Negative for environmental allergies.  Neurological: Negative for seizures, syncope, light-headedness and headaches.  Hematological: Negative for adenopathy.  Psychiatric/Behavioral: Negative for agitation, dysphoric mood and suicidal ideas. The patient is not nervous/anxious.     Per HPI unless specifically indicated above     Objective:    BP 127/73 (BP Location: Right Arm, Patient Position: Sitting, Cuff Size: Normal)   Pulse 82   Temp (!) 97.5 F (36.4 C)   Ht 5\' 8"  (1.727 m)   Wt 232 lb (105.2 kg)   SpO2 97%   BMI 35.28 kg/m   Wt Readings from Last 3 Encounters:  11/13/17 232 lb (105.2 kg)  10/08/17 237 lb (107.5 kg)  05/27/17 227 lb (103 kg)    Physical Exam  Constitutional: He is oriented to person, place, and time. He appears well-developed and well-nourished.  Pulmonary/Chest: Effort normal.  Musculoskeletal:       Right foot: There is deformity (hallux valgus). There is normal range of motion,  no tenderness, no bony tenderness, no swelling and normal capillary refill.  Athletes foot all webspaces.  Some breakdown of the skin on plantar surface.  No redness or drainage. No swelling.  The sole over the heel and arch is very thickened.  Neurological: He is alert and oriented to person, place, and time.  Skin: Skin is warm and dry.  Psychiatric: He has a normal mood and affect. His behavior is normal.  Nursing note and vitals reviewed.         Assessment & Plan:    Encounter Diagnoses  Name Primary?  . Tinea pedis of right foot Yes  . Hallux valgus of right foot    -counseled pt on foot care.  He is to use antifungal cream forefoot and between toes.  aquaphor on heel.  Avoid scratching.  Make sure feet completely dry after showering -follow up as scheduled.  RTO sooner for any problems

## 2017-12-10 ENCOUNTER — Ambulatory Visit: Payer: Self-pay | Admitting: Physician Assistant

## 2017-12-10 VITALS — BP 135/82 | HR 67 | Temp 98.1°F | Ht 68.0 in | Wt 234.0 lb

## 2017-12-10 DIAGNOSIS — M25521 Pain in right elbow: Secondary | ICD-10-CM

## 2017-12-10 MED ORDER — IBUPROFEN 600 MG PO TABS: 600.0000 mg | ORAL_TABLET | Freq: Three times a day (TID) | ORAL | 0 refills | Status: DC | PRN

## 2017-12-10 NOTE — Progress Notes (Signed)
BP 135/82 (BP Location: Left Arm, Patient Position: Sitting, Cuff Size: Normal)   Pulse 67   Temp 98.1 F (36.7 C)   Ht 5\' 8"  (1.727 m)   Wt 234 lb (106.1 kg)   SpO2 97%   BMI 35.58 kg/m    Subjective:    Patient ID: Mark Walter, male    DOB: 06-02-1956, 62 y.o.   MRN: [id number]  HPI: Mark Walter is a 62 y.o. male presenting on 12/10/2017 for Arm Pain (started thursday.  no injury)   HPI   Chief Complaint  Patient presents with  . Arm Pain    started thursday.  no injury   He fell Thursday and may have bumped it then.  He is not having pain anywhere except the R elbow.  Relevant past medical, surgical, family and social history reviewed and updated as indicated. Interim medical history since our last visit reviewed. Allergies and medications reviewed and updated.  CURRENT MEDS: Asa Atorvastatin metoprolol  Review of Systems  Constitutional: Negative for appetite change, chills, diaphoresis, fatigue, fever and unexpected weight change.  HENT: Negative for congestion, dental problem, drooling, ear pain, facial swelling, hearing loss, mouth sores, sneezing, sore throat, trouble swallowing and voice change.   Eyes: Negative for pain, discharge, redness, itching and visual disturbance.  Respiratory: Negative for cough, choking, shortness of breath and wheezing.   Cardiovascular: Negative for chest pain, palpitations and leg swelling.  Gastrointestinal: Negative for abdominal pain, blood in stool, constipation, diarrhea and vomiting.  Endocrine: Negative for cold intolerance, heat intolerance and polydipsia.  Genitourinary: Negative for decreased urine volume, dysuria and hematuria.  Musculoskeletal: Negative for arthralgias, back pain and gait problem.  Skin: Negative for rash.  Allergic/Immunologic: Negative for environmental allergies.  Neurological: Negative for seizures, syncope, light-headedness and headaches.  Hematological: Negative for adenopathy.   Psychiatric/Behavioral: Negative for agitation, dysphoric mood and suicidal ideas. The patient is not nervous/anxious.     Per HPI unless specifically indicated above     Objective:    BP 135/82 (BP Location: Left Arm, Patient Position: Sitting, Cuff Size: Normal)   Pulse 67   Temp 98.1 F (36.7 C)   Ht 5\' 8"  (1.727 m)   Wt 234 lb (106.1 kg)   SpO2 97%   BMI 35.58 kg/m   Wt Readings from Last 3 Encounters:  12/10/17 234 lb (106.1 kg)  11/13/17 232 lb (105.2 kg)  10/08/17 237 lb (107.5 kg)    Physical Exam  Constitutional: He is oriented to person, place, and time. He appears well-developed and well-nourished.  HENT:  Head: Normocephalic and atraumatic.  Pulmonary/Chest: Effort normal.  Musculoskeletal:       Right forearm: He exhibits tenderness, bony tenderness and swelling.  Mild R elbow swelling. No redness or heat.  Tenderness lateral epicondyle.  Mild pain with flexion and supination.  Forearm and wrist normal.  R shoulder exam benign as well.  Strong R radial pulse.   Neurological: He is alert and oriented to person, place, and time. He has normal reflexes.  Skin: Skin is warm and dry.  Psychiatric: He has a normal mood and affect. His behavior is normal.  Vitals reviewed.       Assessment & Plan:    Encounter Diagnosis  Name Primary?  . Right elbow pain Yes     -pt to ice the elbow 10-20 minutes 4 or 5 times daily. -ibu prn -xray the elbow -pt was given [facility] application

## 2017-12-11 ENCOUNTER — Ambulatory Visit (HOSPITAL_COMMUNITY)
Admission: RE | Admit: 2017-12-11 | Discharge: 2017-12-11 | Disposition: A | Discharge status: Home / Self Care | Payer: Self-pay | Source: Ambulatory Visit | Attending: Physician Assistant | Admitting: Physician Assistant

## 2017-12-11 DIAGNOSIS — M7989 Other specified soft tissue disorders: Secondary | ICD-10-CM | POA: Insufficient documentation

## 2017-12-11 DIAGNOSIS — M25521 Pain in right elbow: Secondary | ICD-10-CM | POA: Insufficient documentation

## 2018-02-05 ENCOUNTER — Ambulatory Visit: Payer: Self-pay | Admitting: Physician Assistant

## 2018-02-05 ENCOUNTER — Encounter: Payer: Self-pay | Admitting: Physician Assistant

## 2018-02-05 VITALS — BP 139/78 | HR 60 | Temp 97.7°F | Ht 68.0 in | Wt 232.0 lb

## 2018-02-05 DIAGNOSIS — I10 Essential (primary) hypertension: Secondary | ICD-10-CM

## 2018-02-05 DIAGNOSIS — R7303 Prediabetes: Secondary | ICD-10-CM

## 2018-02-05 DIAGNOSIS — H6121 Impacted cerumen, right ear: Secondary | ICD-10-CM

## 2018-02-05 DIAGNOSIS — E785 Hyperlipidemia, unspecified: Secondary | ICD-10-CM

## 2018-02-05 MED ORDER — LISINOPRIL 10 MG PO TABS: 10.0000 mg | ORAL_TABLET | Freq: Every day | ORAL | 1 refills | Status: DC

## 2018-02-05 NOTE — Progress Notes (Signed)
BP 139/78 (BP Location: Right Arm, Patient Position: Sitting, Cuff Size: Normal)   Pulse 60   Temp 97.7 F (36.5 C) (Other (Comment))   Ht 5\' 8"  (1.727 m)   Wt 232 lb (105.2 kg)   SpO2 96%   BMI 35.28 kg/m    Subjective:    Patient ID: Mark Walter, male    DOB: 1955/09/29, 62 y.o.   MRN: [id number]  HPI: Mark Walter is a 62 y.o. male presenting on 02/05/2018 for Hyperlipidemia and Hypertension   HPI   Pt didn't get labs drawn.  He didn't bring his meds with him today.  He is feeling well except for R ear feels clogged.   Relevant past medical, surgical, family and social history reviewed and updated as indicated. Interim medical history since our last visit reviewed. Allergies and medications reviewed and updated.   Current Outpatient Medications:  .  aspirin EC 81 MG tablet, Take 81 mg by mouth daily., Disp: , Rfl:  .  atorvastatin (LIPITOR) 40 MG tablet, Take 1 tablet (40 mg total) by mouth daily., Disp: 90 tablet, Rfl: 3 .  ibuprofen (ADVIL,MOTRIN) 600 MG tablet, Take 1 tablet (600 mg total) by mouth every 8 (eight) hours as needed., Disp: 30 tablet, Rfl: 0 .  metoprolol (LOPRESSOR) 50 MG tablet, Take 1 tablet (50 mg total) by mouth 2 (two) times daily., Disp: 180 tablet, Rfl: 2 .  sildenafil (VIAGRA) 100 MG tablet, Take 0.5-1 tablets (50-100 mg total) by mouth daily as needed for erectile dysfunction. (Patient not taking: Reported on 12/10/2017), Disp: 10 tablet, Rfl: 1   Review of Systems  Constitutional: Negative for appetite change, chills, diaphoresis, fatigue, fever and unexpected weight change.  HENT: Positive for ear pain (stopped up). Negative for congestion, dental problem, drooling, facial swelling, hearing loss, mouth sores, sneezing, sore throat, trouble swallowing and voice change.   Eyes: Negative for pain, discharge, redness, itching and visual disturbance.  Respiratory: Negative for cough, choking, shortness of breath and wheezing.   Cardiovascular:  Negative for chest pain, palpitations and leg swelling.  Gastrointestinal: Negative for abdominal pain, blood in stool, constipation, diarrhea and vomiting.  Endocrine: Negative for cold intolerance, heat intolerance and polydipsia.  Genitourinary: Negative for decreased urine volume, dysuria and hematuria.  Musculoskeletal: Negative for arthralgias, back pain and gait problem.  Skin: Negative for rash.  Allergic/Immunologic: Negative for environmental allergies.  Neurological: Negative for seizures, syncope, light-headedness and headaches.  Hematological: Negative for adenopathy.  Psychiatric/Behavioral: Negative for agitation, dysphoric mood and suicidal ideas. The patient is not nervous/anxious.     Per HPI unless specifically indicated above     Objective:    BP 139/78 (BP Location: Right Arm, Patient Position: Sitting, Cuff Size: Normal)   Pulse 60   Temp 97.7 F (36.5 C) (Other (Comment))   Ht 5\' 8"  (1.727 m)   Wt 232 lb (105.2 kg)   SpO2 96%   BMI 35.28 kg/m   Wt Readings from Last 3 Encounters:  02/05/18 232 lb (105.2 kg)  12/10/17 234 lb (106.1 kg)  11/13/17 232 lb (105.2 kg)    Physical Exam  Constitutional: He is oriented to person, place, and time. He appears well-developed and well-nourished.  HENT:  Head: Normocephalic and atraumatic.  Right Ear: A foreign body (cerumen) is present.  Left Ear: Tympanic membrane and ear canal normal.  Cerumen wouldn't completely wash out with lavage  Neck: Neck supple.  Cardiovascular: Normal rate and regular rhythm.  Pulmonary/Chest: Effort normal  and breath sounds normal. He has no wheezes.  Abdominal: Soft. Bowel sounds are normal. There is no hepatosplenomegaly. There is no tenderness.  Musculoskeletal: He exhibits no edema.  Lymphadenopathy:    He has no cervical adenopathy.  Neurological: He is alert and oriented to person, place, and time.  Skin: Skin is warm and dry.  Psychiatric: He has a normal mood and affect.  His behavior is normal.  Vitals reviewed.       Assessment & Plan:   Encounter Diagnoses  Name Primary?  . Essential hypertension, benign Yes  . Impacted cerumen, right ear   . Hyperlipidemia, unspecified hyperlipidemia type   . Prediabetes      -Get labs drawn -pt to use earwax softening drops daily and RTO on Monday for lavage -Add lisinopril -follow up  1 month to check blood pressure

## 2018-02-10 ENCOUNTER — Ambulatory Visit: Payer: Self-pay | Admitting: Physician Assistant

## 2018-02-10 DIAGNOSIS — H6121 Impacted cerumen, right ear: Secondary | ICD-10-CM

## 2018-02-10 NOTE — Progress Notes (Signed)
Pt is here today to complete right ear lavage after using earwax softening drops over the weekend.  LPN performed ear lavage and was able to remove cerumen from right ear.  Pt is to follow up as scheduled.

## 2018-02-12 ENCOUNTER — Other Ambulatory Visit (HOSPITAL_COMMUNITY)
Admission: RE | Admit: 2018-02-12 | Discharge: 2018-02-12 | Disposition: A | Discharge status: Home / Self Care | Payer: Self-pay | Source: Ambulatory Visit | Attending: Physician Assistant | Admitting: Physician Assistant

## 2018-02-12 DIAGNOSIS — Z125 Encounter for screening for malignant neoplasm of prostate: Secondary | ICD-10-CM | POA: Insufficient documentation

## 2018-02-12 DIAGNOSIS — R7303 Prediabetes: Secondary | ICD-10-CM | POA: Insufficient documentation

## 2018-02-12 DIAGNOSIS — I10 Essential (primary) hypertension: Secondary | ICD-10-CM | POA: Insufficient documentation

## 2018-02-12 DIAGNOSIS — E785 Hyperlipidemia, unspecified: Secondary | ICD-10-CM | POA: Insufficient documentation

## 2018-02-12 LAB — LIPID PANEL
CHOL/HDL RATIO: 5.2 ratio
Cholesterol: 249 mg/dL — ABNORMAL HIGH (ref 0–200)
HDL: 48 mg/dL (ref >40)
LDL CALC: 173 mg/dL — AB (ref 0–99)
TRIGLYCERIDES: 141 mg/dL (ref <150)
VLDL: 28 mg/dL (ref 0–40)

## 2018-02-12 LAB — COMPREHENSIVE METABOLIC PANEL
ALBUMIN: 4 g/dL (ref 3.5–5.0)
ALT: 21 U/L (ref 17–63)
ANION GAP: 7 (ref 5–15)
AST: 21 U/L (ref 15–41)
Alkaline Phosphatase: 72 U/L (ref 38–126)
BUN: 11 mg/dL (ref 6–20)
CO2: 25 mmol/L (ref 22–32)
Calcium: 9.6 mg/dL (ref 8.9–10.3)
Chloride: 107 mmol/L (ref 101–111)
Creatinine, Ser: 0.98 mg/dL (ref 0.61–1.24)
GFR calc Af Amer: >60 mL/min (ref >60)
GFR calc non Af Amer: >60 mL/min (ref >60)
GLUCOSE: 107 mg/dL — AB (ref 65–99)
POTASSIUM: 4.4 mmol/L (ref 3.5–5.1)
SODIUM: 139 mmol/L (ref 135–145)
Total Bilirubin: 0.7 mg/dL (ref 0.3–1.2)
Total Protein: 7.6 g/dL (ref 6.5–8.1)

## 2018-02-12 LAB — PSA: Prostatic Specific Antigen: 0.6 ng/mL (ref 0.00–4.00)

## 2018-03-10 ENCOUNTER — Ambulatory Visit: Payer: Self-pay | Admitting: Physician Assistant

## 2018-03-12 ENCOUNTER — Encounter: Payer: Self-pay | Admitting: Physician Assistant

## 2018-09-22 ENCOUNTER — Emergency Department (HOSPITAL_COMMUNITY)
Admission: EM | Admit: 2018-09-22 | Discharge: 2018-09-22 | Disposition: A | Discharge status: Home / Self Care | Payer: Self-pay | Attending: Emergency Medicine | Admitting: Emergency Medicine

## 2018-09-22 ENCOUNTER — Emergency Department (HOSPITAL_COMMUNITY): Payer: Self-pay

## 2018-09-22 ENCOUNTER — Encounter (HOSPITAL_COMMUNITY): Payer: Self-pay | Admitting: Emergency Medicine

## 2018-09-22 DIAGNOSIS — F17210 Nicotine dependence, cigarettes, uncomplicated: Secondary | ICD-10-CM | POA: Insufficient documentation

## 2018-09-22 DIAGNOSIS — E785 Hyperlipidemia, unspecified: Secondary | ICD-10-CM | POA: Insufficient documentation

## 2018-09-22 DIAGNOSIS — M109 Gout, unspecified: Secondary | ICD-10-CM | POA: Insufficient documentation

## 2018-09-22 DIAGNOSIS — I10 Essential (primary) hypertension: Secondary | ICD-10-CM | POA: Insufficient documentation

## 2018-09-22 MED ORDER — PREDNISONE 10 MG PO TABS: ORAL_TABLET | ORAL | 0 refills | Status: DC

## 2018-09-22 MED ORDER — HYDROCODONE-ACETAMINOPHEN 5-325 MG PO TABS
1.0000 | ORAL_TABLET | Freq: Once | ORAL | Status: AC
  Administered 2018-09-22: 1 via ORAL
  Filled 2018-09-22: qty 1

## 2018-09-22 MED ORDER — PREDNISONE 50 MG PO TABS
60.0000 mg | ORAL_TABLET | Freq: Once | ORAL | Status: AC
  Administered 2018-09-22: 19:00:00 60 mg via ORAL
  Filled 2018-09-22: qty 1

## 2018-09-22 MED ORDER — HYDROCODONE-ACETAMINOPHEN 5-325 MG PO TABS: 1.0000 | ORAL_TABLET | ORAL | 0 refills | Status: DC | PRN

## 2018-09-22 NOTE — ED Provider Notes (Signed)
[facility] EMERGENCY DEPARTMENT Provider Note   CSN: [id number] Arrival date & time: 09/22/18  1749     History   Chief Complaint Chief Complaint  Patient presents with  . Foot Pain    HPI Mark Walter is a 63 y.o. male with a history of hypertension and prediabetes presenting with pain and swelling in his right great toe which started 2 days ago.  He denies injury at the site, endorses he has had a similar episode of this in the past lasting several days but improved after taking Tylenol.  He denies radiation of pain into his foot or ankle, the pain is localized to the base of his right great toe.  He states simply moving the toe or lightly touching it makes the pain worse.  He denies any changes in his skin, lesions, punctures.  Denies personal or family history of gout.  He has taken Tylenol without relief of symptoms.  The history is provided by the patient.    Past Medical History:  Diagnosis Date  . 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 Closed fracture of left distal radius   . Hyperlipemia   . Hypertension   . Pre-diabetes   . Tobacco abuse     Patient Active Problem List   Diagnosis Date Noted  . Hallux valgus of right foot 11/13/2017  . Erectile dysfunction 01/03/2016  . Obesity, unspecified 01/03/2016  . Arthralgia of both knees 10/09/2015  . Essential hypertension, benign 09/12/2015  . Prediabetes 09/12/2015  . Hyperlipidemia 09/12/2015  . Cigarette nicotine dependence, uncomplicated 62/13/0865  . Tinea pedis of both feet 09/12/2015  . KNEE, ARTHRITIS, DEGEN./OSTEO 06/02/2008  . JOINT EFFUSION, LEFT KNEE 06/02/2008    Past Surgical History:  Procedure Laterality Date  . CARPAL TUNNEL RELEASE Left 05/27/2017   Procedure: LEFT CARPAL TUNNEL RELEASE;  Surgeon: Charlotte Crumb, MD;  Location: [place];  Service: Orthopedics;  Laterality: Left;  . NO PAST SURGERIES    . OPEN REDUCTION INTERNAL FIXATION (ORIF)  DISTAL RADIAL FRACTURE Left 05/27/2017   Procedure: OPEN REDUCTION INTERNAL FIXATION (ORIF) LEFT DISTAL RADIAL FRACTURE;  Surgeon: Charlotte Crumb, MD;  Location: [place];  Service: Orthopedics;  Laterality: Left;        Home Medications    Prior to Admission medications   Medication Sig Start Date End Date Taking? Authorizing Provider  aspirin EC 81 MG tablet Take 81 mg by mouth daily.    [provider]  atorvastatin (LIPITOR) 40 MG tablet Take 1 tablet (40 mg total) by mouth daily. 10/10/17   Soyla Dryer, PA-C  HYDROcodone-acetaminophen (NORCO/VICODIN) 5-325 MG tablet Take 1 tablet by mouth every 4 (four) hours as needed. 09/22/18   Christella App, Almyra Free, PA-C  ibuprofen (ADVIL,MOTRIN) 600 MG tablet Take 1 tablet (600 mg total) by mouth every 8 (eight) hours as needed. 12/10/17   Soyla Dryer, PA-C  lisinopril (PRINIVIL,ZESTRIL) 10 MG tablet Take 1 tablet (10 mg total) by mouth daily. 02/05/18   Soyla Dryer, PA-C  metoprolol (LOPRESSOR) 50 MG tablet Take 1 tablet (50 mg total) by mouth 2 (two) times daily. 08/08/16   Soyla Dryer, PA-C  predniSONE (DELTASONE) 10 MG tablet Take 6 tablets day one, 5 tablets day two, 4 tablets day three, 3 tablets day four, 2 tablets day five, then 1 tablet day six 09/22/18   Levia Waltermire, Almyra Free, PA-C  sildenafil (VIAGRA) 100 MG tablet Take 0.5-1 tablets (50-100 mg total) by  mouth daily as needed for erectile dysfunction. Patient not taking: Reported on 12/10/2017 05/20/17   Soyla Dryer, PA-C    Family History Family History  Problem Relation Age of Onset  . Cancer Father   . Diabetes Sister   . Diabetes Brother     Social History Social History   Tobacco Use  . Smoking status: Current Some Day Smoker    Packs/day: 0.25    Years: 39.00    Pack years: 9.75    Types: Cigarettes    Start date: 01/04/1974  . Smokeless tobacco: Never Used  Substance Use Topics  . Alcohol use: Yes    Alcohol/week: 0.0 standard drinks     Comment: social  . Drug use: No    Comment: none since age 49     Allergies   Pollen extract   Review of Systems Review of Systems  Constitutional: Negative for fever.  Musculoskeletal: Positive for arthralgias and joint swelling. Negative for myalgias.  Skin: Positive for color change.  Neurological: Negative for weakness and numbness.     Physical Exam Updated Vital Signs BP (!) 157/80 (BP Location: Left Arm)   Pulse 66   Temp 97.7 F (36.5 C) (Temporal)   Resp 18   Ht 5\' 8"  (1.727 m)   Wt 99.8 kg   SpO2 99%   BMI 33.45 kg/m   Physical Exam Constitutional:      Appearance: He is well-developed.  HENT:     Head: Atraumatic.  Neck:     Musculoskeletal: Normal range of motion.  Cardiovascular:     Comments: Pulses equal bilaterally Musculoskeletal:        General: Swelling and tenderness present.     Comments: Patient is exquisitely tender to palpation at his right MTP of his great toe.  There is mild erythema at the site, mild edema as well.  His skin is intact.  Dorsalis pedis pulse is easily palpable.  He does have scaling chapped skin at his heel.  There is no other skin lesions, although he does have hyperkeratotic nails suggesting tinea infection.  There is no red streaking, no increased warmth of the skin.  Skin:    General: Skin is warm and dry.  Neurological:     Mental Status: He is alert.     Sensory: No sensory deficit.     Deep Tendon Reflexes: Reflexes normal.      ED Treatments / Results  Labs (all labs ordered are listed, but only abnormal results are displayed) Labs Reviewed - No data to display  EKG None  Radiology Dg Foot Complete Right  Result Date: 09/22/2018 CLINICAL DATA:  Pain and swelling EXAM: RIGHT FOOT COMPLETE - 3+ VIEW COMPARISON:  None. FINDINGS: Frontal, oblique, and lateral views were obtained. There is no acute fracture or dislocation. There is an old healed fracture of the fourth proximal phalanx with remodeling. There  is bony overgrowth at the distal first metatarsal with hallux valgus deformity at the first MTP joint. There is mild narrowing of the first MTP joint. There are erosions in the distal first metatarsal. There is an inferior calcaneal spur. IMPRESSION: Bony overgrowth at the distal first metatarsal with erosions in this area. This appearance raises question of underlying gout. Hallux valgus deformity at the first MTP joint with moderate narrowing of the first MTP joint. Other joint spaces appear normal. No acute fracture or dislocation. Old healed fracture fourth proximal phalanx. There is an inferior calcaneal spur. Electronically Signed   By:  Lowella Grip III M.D.   On: 09/22/2018 19:48    Procedures Procedures (including critical care time)  Medications Ordered in ED Medications  HYDROcodone-acetaminophen (NORCO/VICODIN) 5-325 MG per tablet 1 tablet (1 tablet Oral Given 09/22/18 1929)  predniSONE (DELTASONE) tablet 60 mg (60 mg Oral Given 09/22/18 1929)     Initial Impression / Assessment and Plan / ED Course  I have reviewed the triage vital signs and the nursing notes.  Pertinent labs & imaging results that were available during my care of the patient were reviewed by me and considered in my medical decision making (see chart for details).     Patient with exam, history and x-ray suggesting gout of his right great toe.  Discussed definitive diagnosis including blood test and joint aspiration.  He will discuss this with his PCP about having these tests done.  In the interim he was placed on prednisone and hydrocodone for resolution of the suspected gout flare.  Also discussed elevation and heat.  Exam and history is not consistent with septic joint.  PRN follow-up anticipated.  Final Clinical Impressions(s) / ED Diagnoses   Final diagnoses:  Acute gout involving toe of right foot, unspecified cause    ED Discharge Orders         Ordered    predniSONE (DELTASONE) 10 MG tablet      09/22/18 2007    HYDROcodone-acetaminophen (NORCO/VICODIN) 5-325 MG tablet  Every 4 hours PRN     09/22/18 2007           Evalee Jefferson, PA-C 09/23/18 0131    Long, W[facility], MD 09/23/18 1005

## 2018-09-22 NOTE — ED Triage Notes (Signed)
Pt reports pain in base of right great toe since Saturday with no injury.

## 2018-09-22 NOTE — Discharge Instructions (Addendum)
Your x-rays suggest that this is a gout flare.  However as discussed you may need further test to definitively call this gout, however your symptoms are very suspicious for this condition.  Take your next dose of prednisone tomorrow evening.  You may use the hydrocodone if needed for pain relief.  This will make you drowsy so do not drive within 4 hours of taking hydrocodone.  Use elevation and a gentle heating pad on your foot and toe which can also help relieve pain symptoms.

## 2019-06-07 ENCOUNTER — Emergency Department (HOSPITAL_COMMUNITY): Payer: Medicare HMO

## 2019-06-07 ENCOUNTER — Emergency Department (HOSPITAL_COMMUNITY)
Admission: EM | Admit: 2019-06-07 | Discharge: 2019-06-07 | Disposition: A | Discharge status: Home / Self Care | Payer: Medicare HMO | Attending: Emergency Medicine | Admitting: Emergency Medicine

## 2019-06-07 ENCOUNTER — Encounter (HOSPITAL_COMMUNITY): Payer: Self-pay | Admitting: Emergency Medicine

## 2019-06-07 ENCOUNTER — Other Ambulatory Visit: Payer: Self-pay

## 2019-06-07 DIAGNOSIS — Z7982 Long term (current) use of aspirin: Secondary | ICD-10-CM | POA: Insufficient documentation

## 2019-06-07 DIAGNOSIS — S0591XA Unspecified injury of right eye and orbit, initial encounter: Secondary | ICD-10-CM | POA: Diagnosis not present

## 2019-06-07 DIAGNOSIS — Z79899 Other long term (current) drug therapy: Secondary | ICD-10-CM | POA: Diagnosis not present

## 2019-06-07 DIAGNOSIS — Y939 Activity, unspecified: Secondary | ICD-10-CM | POA: Insufficient documentation

## 2019-06-07 DIAGNOSIS — Y999 Unspecified external cause status: Secondary | ICD-10-CM | POA: Insufficient documentation

## 2019-06-07 DIAGNOSIS — I10 Essential (primary) hypertension: Secondary | ICD-10-CM | POA: Diagnosis not present

## 2019-06-07 DIAGNOSIS — Y9289 Other specified places as the place of occurrence of the external cause: Secondary | ICD-10-CM | POA: Insufficient documentation

## 2019-06-07 DIAGNOSIS — W01198A Fall on same level from slipping, tripping and stumbling with subsequent striking against other object, initial encounter: Secondary | ICD-10-CM | POA: Diagnosis not present

## 2019-06-07 DIAGNOSIS — F17210 Nicotine dependence, cigarettes, uncomplicated: Secondary | ICD-10-CM | POA: Insufficient documentation

## 2019-06-07 DIAGNOSIS — S0990XA Unspecified injury of head, initial encounter: Secondary | ICD-10-CM | POA: Diagnosis present

## 2019-06-07 MED ORDER — ERYTHROMYCIN 5 MG/GM OP OINT: TOPICAL_OINTMENT | OPHTHALMIC | 0 refills | Status: DC

## 2019-06-07 MED ORDER — FLUORESCEIN SODIUM 1 MG OP STRP
1.0000 | ORAL_STRIP | Freq: Once | OPHTHALMIC | Status: AC
  Administered 2019-06-07: 1 via OPHTHALMIC
  Filled 2019-06-07: qty 1

## 2019-06-07 MED ORDER — ERYTHROMYCIN 5 MG/GM OP OINT: TOPICAL_OINTMENT | Freq: Once | OPHTHALMIC | Status: DC

## 2019-06-07 MED ORDER — TETRACAINE HCL 0.5 % OP SOLN
2.0000 [drp] | Freq: Once | OPHTHALMIC | Status: AC
  Administered 2019-06-07: 14:00:00 2 [drp] via OPHTHALMIC
  Filled 2019-06-07: qty 4

## 2019-06-07 NOTE — Discharge Instructions (Addendum)
Go directly to the eye doctor's office after discharge from the emergency department.  When you arrive at the office, the door will be locked.  Call Dr. Talbert Forest on his cell phone, [phone], and he will let you in the building.

## 2019-06-07 NOTE — ED Triage Notes (Signed)
Pt states he fell on Thursday on a sidewalk. Since then his right eye has been hurting. Abrasion to right cheek with redness of sclera and tearing, minimal swelling.

## 2019-06-07 NOTE — ED Provider Notes (Signed)
[facility] EMERGENCY DEPARTMENT Provider Note   CSN: [id number] Arrival date & time: 06/07/19  1149     History   Chief Complaint Chief Complaint  Patient presents with  . Eye Pain    HPI TRAVARES BLANKEN is a 63 y.o. male.     HPI   EPIC NOLAND is a 63 y.o. male, with a history of HTN, hyperlipidemia, tobacco use, presenting to the ED with right-sided facial trauma that occurred Thursday, September 24. States he tripped and fell, striking the right side of his face on the ground. He complains of pain just inferior to the eye, tearing to the eye, eye redness, and some pain with eye movements, especially when looking to the right. Some increased pain to the right eye with bright sunlight along with increased tearing from the right eye.  Denies anticoagulation.  He does state he wears glasses, but does not need to wear them all the time.  Denies contact lens use. Denies LOC, nausea/vomiting, confusion, syncope, neck/back pain, neuro deficits, vision loss, chest pain, extremity injuries, or any other complaints.   Past Medical History:  Diagnosis Date  . Brain injury [facility]) age 89   states was unconscious for 3 days  . Closed fracture of left distal radius   . Hyperlipemia   . Hypertension   . Pre-diabetes   . Tobacco abuse     Patient Active Problem List   Diagnosis Date Noted  . Hallux valgus of right foot 11/13/2017  . Erectile dysfunction 01/03/2016  . Obesity, unspecified 01/03/2016  . Arthralgia of both knees 10/09/2015  . Essential hypertension, benign 09/12/2015  . Prediabetes 09/12/2015  . Hyperlipidemia 09/12/2015  . Cigarette nicotine dependence, uncomplicated 0000000  . Tinea pedis of both feet 09/12/2015  . KNEE, ARTHRITIS, DEGEN./OSTEO 06/02/2008  . JOINT EFFUSION, LEFT KNEE 06/02/2008    Past Surgical History:  Procedure Laterality Date  . CARPAL TUNNEL RELEASE Left 05/27/2017   Procedure: LEFT CARPAL TUNNEL RELEASE;  Surgeon: Charlotte Crumb, MD;  Location: [place];  Service: Orthopedics;  Laterality: Left;  . NO PAST SURGERIES    . OPEN REDUCTION INTERNAL FIXATION (ORIF) DISTAL RADIAL FRACTURE Left 05/27/2017   Procedure: OPEN REDUCTION INTERNAL FIXATION (ORIF) LEFT DISTAL RADIAL FRACTURE;  Surgeon: Charlotte Crumb, MD;  Location: [place];  Service: Orthopedics;  Laterality: Left;        Home Medications    Prior to Admission medications   Medication Sig Start Date End Date Taking? Authorizing Provider  aspirin EC 81 MG tablet Take 81 mg by mouth daily.   Yes [provider]  atorvastatin (LIPITOR) 40 MG tablet Take 1 tablet (40 mg total) by mouth daily. 10/10/17  Yes Soyla Dryer, PA-C  lisinopril (PRINIVIL,ZESTRIL) 10 MG tablet Take 1 tablet (10 mg total) by mouth daily. 02/05/18  Yes Soyla Dryer, PA-C  metoprolol (LOPRESSOR) 50 MG tablet Take 1 tablet (50 mg total) by mouth 2 (two) times daily. 08/08/16  Yes Soyla Dryer, PA-C  erythromycin ophthalmic ointment Place a 1/2 inch ribbon of ointment into the right lower eyelid.  Use 4 times a day for 5 days. 06/07/19   Elber Galyean C, PA-C  HYDROcodone-acetaminophen (NORCO/VICODIN) 5-325 MG tablet Take 1 tablet by mouth every 4 (four) hours as needed. Patient not taking: Reported on 06/07/2019 09/22/18   Evalee Jefferson, PA-C  ibuprofen (ADVIL,MOTRIN) 600 MG tablet Take 1 tablet (600 mg total) by mouth every 8 (eight) hours as needed. Patient not  taking: Reported on 06/07/2019 12/10/17   Soyla Dryer, PA-C  predniSONE (DELTASONE) 10 MG tablet Take 6 tablets day one, 5 tablets day two, 4 tablets day three, 3 tablets day four, 2 tablets day five, then 1 tablet day six Patient not taking: Reported on 06/07/2019 09/22/18   Evalee Jefferson, PA-C  sildenafil (VIAGRA) 100 MG tablet Take 0.5-1 tablets (50-100 mg total) by mouth daily as needed for erectile dysfunction. Patient not taking: Reported on 12/10/2017 05/20/17   Soyla Dryer,  PA-C    Family History Family History  Problem Relation Age of Onset  . Cancer Father   . Diabetes Sister   . Diabetes Brother     Social History Social History   Tobacco Use  . Smoking status: Current Some Day Smoker    Packs/day: 0.25    Years: 39.00    Pack years: 9.75    Types: Cigarettes    Start date: 01/04/1974  . Smokeless tobacco: Never Used  Substance Use Topics  . Alcohol use: Yes    Alcohol/week: 0.0 standard drinks    Comment: weekly  . Drug use: No     Allergies   Pollen extract   Review of Systems Review of Systems  Constitutional: Negative for diaphoresis.  HENT: Positive for facial swelling.   Eyes: Positive for photophobia, pain and redness. Negative for visual disturbance.  Respiratory: Negative for shortness of breath.   Cardiovascular: Negative for chest pain.  Gastrointestinal: Negative for abdominal pain, nausea and vomiting.  Musculoskeletal: Negative for back pain and neck pain.  All other systems reviewed and are negative.    Physical Exam Updated Vital Signs BP (!) 142/80 (BP Location: Right Arm)   Pulse 76   Temp 98.1 F (36.7 C) (Oral)   Resp 17   Ht 5\' 8"  (1.727 m)   Wt 99.8 kg   SpO2 95%   BMI 33.45 kg/m   Physical Exam Vitals signs and nursing note reviewed.  Constitutional:      General: He is not in acute distress.    Appearance: He is well-developed. He is not diaphoretic.  HENT:     Head: Normocephalic.     Comments: Tenderness and some swelling over the right zygomatic region of the face with overlying abrasion.  Abrasion appears to be healing well. No noted instability in this region. The rest of the face was examined without evidence of other injury noted.  No noted signs of injury to the scalp.    Mouth/Throat:     Mouth: Mucous membranes are moist.     Pharynx: Oropharynx is clear.  Eyes:     Conjunctiva/sclera: Conjunctivae normal.     Comments: No contact lenses in place.  No noted hyphema. Patient  does have some pain in the right eye with EOMs, especially with movement directly to the right.  No noted signs of entrapment. Conjunctival and scleral injection across the entire right eye. Woods Lamp exam shows increased uptake of fluorescein across the right sclera.  There is a tiny area of uptake around the 8 o'clock position of the right cornea. Slit lamp was unavailable for this exam.   Tono-Pen values: Right eye: 14  Left eye: 13    Visual Acuity  Right Eye Distance: 20/200 Left Eye Distance: 20/70 Bilateral Distance: 20/70  Right Eye Near:   Left Eye Near:    Bilateral Near:      Neck:     Musculoskeletal: Neck supple.  Cardiovascular:     Rate  and Rhythm: Normal rate and regular rhythm.     Pulses: Normal pulses.     Heart sounds: Normal heart sounds.  Pulmonary:     Effort: Pulmonary effort is normal. No respiratory distress.     Breath sounds: Normal breath sounds.  Abdominal:     Palpations: Abdomen is soft.     Tenderness: There is no abdominal tenderness. There is no guarding.  Musculoskeletal:     Right lower leg: No edema.     Left lower leg: No edema.     Comments: Normal motor function intact in all extremities. No midline spinal tenderness.   Skin:    General: Skin is warm and dry.  Neurological:     Mental Status: He is alert.     Comments: Sensation grossly intact to light touch in the extremities.  Grip strengths equal bilaterally.  Strength 5/5 in all extremities. No gait disturbance. Coordination intact. Cranial nerves III-XII grossly intact. No facial droop.   Psychiatric:        Mood and Affect: Mood and affect normal.        Speech: Speech normal.        Behavior: Behavior normal.      ED Treatments / Results  Labs (all labs ordered are listed, but only abnormal results are displayed) Labs Reviewed - No data to display  EKG None  Radiology Ct Head Wo Contrast  Result Date: 06/07/2019 CLINICAL DATA:  Pt states he fell on Thursday on a  sidewalk. Since then his right eye has been hurting. Abrasion to right cheek with redness of sclera and tearing, minimal swelling. Maxface trauma blunt; Head trauma, minor, GCS>=13, high clinical risk, initial exam EXAM: CT HEAD WITHOUT CONTRAST CT MAXILLOFACIAL WITHOUT CONTRAST TECHNIQUE: Multidetector CT imaging of the head and maxillofacial structures were performed using the standard protocol without intravenous contrast. Multiplanar CT image reconstructions of the maxillofacial structures were also generated. COMPARISON:  Head CT 06/09/2007 FINDINGS: CT HEAD FINDINGS Brain: No acute intracranial hemorrhage. No focal mass lesion. No CT evidence of acute infarction. No midline shift or mass effect. No hydrocephalus. Basilar cisterns are patent. Vascular: No hyperdense vessel or unexpected calcification. Skull: Normal. Negative for fracture or focal lesion. Sinuses/Orbits: Paranasal sinuses and mastoid air cells are clear. Orbits are clear. Other: None. CT MAXILLOFACIAL FINDINGS Osseous: Orbital walls are intact. Zygomatic arch is normal. No maxillary sinus wall fracture. Pterygoid plates are normal. Mandibular condyles are located.  No mandibular fracture. Orbits: No proptosis.  Globes intact.  Intraconal contents clear. Sinuses: No fluid in the sinuses.  Mild mucosal thickening. Soft tissues: Soft tissue swelling over the RIGHT cheek. Potential very small 1 mm foreign body in the RIGHT cheek beneath orbital rim (image 41/6) IMPRESSION: 1. No intracranial trauma. 2. No facial bone fracture. 3. Soft tissue swelling over the RIGHT cheek. Potential very small radiodense foreign body within the subcutaneous tissue of the RIGHT cheek. Electronically Signed   By: Suzy Bouchard M.D.   On: 06/07/2019 14:34   Ct Maxillofacial Wo Contrast  Result Date: 06/07/2019 CLINICAL DATA:  Pt states he fell on Thursday on a sidewalk. Since then his right eye has been hurting. Abrasion to right cheek with redness of sclera and  tearing, minimal swelling. Maxface trauma blunt; Head trauma, minor, GCS>=13, high clinical risk, initial exam EXAM: CT HEAD WITHOUT CONTRAST CT MAXILLOFACIAL WITHOUT CONTRAST TECHNIQUE: Multidetector CT imaging of the head and maxillofacial structures were performed using the standard protocol without intravenous contrast. Multiplanar CT  image reconstructions of the maxillofacial structures were also generated. COMPARISON:  Head CT 06/09/2007 FINDINGS: CT HEAD FINDINGS Brain: No acute intracranial hemorrhage. No focal mass lesion. No CT evidence of acute infarction. No midline shift or mass effect. No hydrocephalus. Basilar cisterns are patent. Vascular: No hyperdense vessel or unexpected calcification. Skull: Normal. Negative for fracture or focal lesion. Sinuses/Orbits: Paranasal sinuses and mastoid air cells are clear. Orbits are clear. Other: None. CT MAXILLOFACIAL FINDINGS Osseous: Orbital walls are intact. Zygomatic arch is normal. No maxillary sinus wall fracture. Pterygoid plates are normal. Mandibular condyles are located.  No mandibular fracture. Orbits: No proptosis.  Globes intact.  Intraconal contents clear. Sinuses: No fluid in the sinuses.  Mild mucosal thickening. Soft tissues: Soft tissue swelling over the RIGHT cheek. Potential very small 1 mm foreign body in the RIGHT cheek beneath orbital rim (image 41/6) IMPRESSION: 1. No intracranial trauma. 2. No facial bone fracture. 3. Soft tissue swelling over the RIGHT cheek. Potential very small radiodense foreign body within the subcutaneous tissue of the RIGHT cheek. Electronically Signed   By: Suzy Bouchard M.D.   On: 06/07/2019 14:34    Procedures Procedures (including critical care time)  Medications Ordered in ED Medications  fluorescein ophthalmic strip 1 strip (1 strip Right Eye Given 06/07/19 1349)  tetracaine (PONTOCAINE) 0.5 % ophthalmic solution 2 drop (2 drops Right Eye Given 06/07/19 1350)     Initial Impression / Assessment  and Plan / ED Course  I have reviewed the triage vital signs and the nursing notes.  Pertinent labs & imaging results that were available during my care of the patient were reviewed by me and considered in my medical decision making (see chart for details).  Clinical Course as of Jun 07 1739  Sun Jun 07, 2019  1545 Spoke with Dr. Talbert Forest, ophthalmologist.  He recommends patient come directly to his office after discharge from the ED. While on the phone with Dr. Talbert Forest, I passed this information on to the patient.  Patient states, "Today doesn't work for me. I can't do it today." Dr. Talbert Forest stressed the importance of seeing the patient today and that if the patient does not see him today that it will be [place].  Patient acknowledges this and states he may be able to go to the office later in the week.  Dr. Talbert Forest states without seeing the patient he is unable to make a good recommendation as far as antibiotics, however, he states erythromycin ointment is a good general choice. After getting off the phone with the ophthalmologist, patient tells me he is unable to go to the ophthalmologist's office today due to lack of transportation.   [SJ]  [id number] I went in to discuss possible alternative transportation options with the patient.  He now states his sister is coming to pick him up from the ED and will be able to take him to [facility] to meet with the ophthalmologist.  They expect to be able to be there between 5:30 PM and 6 PM.   [SJ]  17 Updated Dr. Talbert Forest, who gave me his cell phone number to pass along to the patient.  Dr. Talbert Forest will be at the office by 5:30pm. Patient to call once he arrives.   [SJ]    Clinical Course User Index [SJ] Johniya Durfee C, PA-C       Patient presents with right facial injury that occurred several days ago. CT with some soft tissue swelling, but no other concerning abnormality.  There was a small foreign body noted on the scan.  I was unable to correlate  this clinically.  I discussed the possibility of foreign body with the patient and discussed symptoms to keep in mind while the area heals as well as return precautions.  Patient was instructed to go directly to the ophthalmologist office upon discharge.  He acknowledges these instructions and states he will do so.   Findings and plan of care discussed with Fredia Sorrow, MD.   Final Clinical Impressions(s) / ED Diagnoses   Final diagnoses:  Right eye injury, initial encounter    ED Discharge Orders         Ordered    erythromycin ophthalmic ointment  Status:  Discontinued     06/07/19 1610    erythromycin ophthalmic ointment     06/07/19 1611           Lorayne Bender, PA-C 06/07/19 1742    Fredia Sorrow, MD 06/20/19 [phone]

## 2019-07-29 ENCOUNTER — Ambulatory Visit (INDEPENDENT_AMBULATORY_CARE_PROVIDER_SITE_OTHER): Payer: Medicare HMO | Admitting: Orthopedic Surgery

## 2019-07-29 ENCOUNTER — Other Ambulatory Visit: Payer: Self-pay

## 2019-07-29 ENCOUNTER — Ambulatory Visit: Payer: Medicare HMO

## 2019-07-29 VITALS — BP 127/70 | HR 52 | Temp 97.0°F | Ht 69.0 in | Wt 230.0 lb

## 2019-07-29 DIAGNOSIS — M25562 Pain in left knee: Secondary | ICD-10-CM | POA: Diagnosis not present

## 2019-07-29 MED ORDER — MELOXICAM 7.5 MG PO TABS: 7.5000 mg | ORAL_TABLET | Freq: Every day | ORAL | 5 refills | Status: DC

## 2019-07-29 NOTE — Patient Instructions (Signed)

## 2019-07-29 NOTE — Progress Notes (Signed)
Mark Walter  07/29/2019  Body mass index is 33.97 kg/m.   HISTORY SECTION :  Chief Complaint  Patient presents with  . Knee Pain    Left knee pain.   63 year old male comes in with mild to moderate pain left knee for several years associated with clicking popping with most of his pain occurring when he gets up or goes up and down the stairs.  Does not have a lot of pain when he is walking  Review of Systems  All other systems reviewed and are negative.    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   Past Surgical History:  Procedure Laterality Date  . CARPAL TUNNEL RELEASE Left 05/27/2017   Procedure: LEFT CARPAL TUNNEL RELEASE;  Surgeon: Charlotte Crumb, MD;  Location: [place];  Service: Orthopedics;  Laterality: Left;  . NO PAST SURGERIES    . OPEN REDUCTION INTERNAL FIXATION (ORIF) DISTAL RADIAL FRACTURE Left 05/27/2017   Procedure: OPEN REDUCTION INTERNAL FIXATION (ORIF) LEFT DISTAL RADIAL FRACTURE;  Surgeon: Charlotte Crumb, MD;  Location: [place];  Service: Orthopedics;  Laterality: Left;    Body mass index is 33.97 kg/m.   Allergies  Allergen Reactions  . Pollen Extract Other (See Comments)    sneezing     Current Outpatient Medications:  .  aspirin EC 81 MG tablet, Take 81 mg by mouth daily., Disp: , Rfl:  .  atorvastatin (LIPITOR) 40 MG tablet, Take 1 tablet (40 mg total) by mouth daily., Disp: 90 tablet, Rfl: 3 .  erythromycin ophthalmic ointment, Place a 1/2 inch ribbon of ointment into the right lower eyelid.  Use 4 times a day for 5 days., Disp: 1 g, Rfl: 0 .  ibuprofen (ADVIL,MOTRIN) 600 MG tablet, Take 1 tablet (600 mg total) by mouth every 8 (eight) hours as needed., Disp: 30 tablet, Rfl: 0 .  lisinopril (PRINIVIL,ZESTRIL) 10 MG tablet, Take 1 tablet (10 mg total) by mouth daily., Disp: 30 tablet,  Rfl: 1 .  metoprolol (LOPRESSOR) 50 MG tablet, Take 1 tablet (50 mg total) by mouth 2 (two) times daily., Disp: 180 tablet, Rfl: 2 .  HYDROcodone-acetaminophen (NORCO/VICODIN) 5-325 MG tablet, Take 1 tablet by mouth every 4 (four) hours as needed. (Patient not taking: Reported on 06/07/2019), Disp: 15 tablet, Rfl: 0 .  predniSONE (DELTASONE) 10 MG tablet, Take 6 tablets day one, 5 tablets day two, 4 tablets day three, 3 tablets day four, 2 tablets day five, then 1 tablet day six (Patient not taking: Reported on 07/29/2019), Disp: 21 tablet, Rfl: 0 .  sildenafil (VIAGRA) 100 MG tablet, Take 0.5-1 tablets (50-100 mg total) by mouth daily as needed for erectile dysfunction. (Patient not taking: Reported on 12/10/2017), Disp: 10 tablet, Rfl: 1   PHYSICAL EXAM SECTION: 1) BP 127/70   Pulse (!) 52   Temp (!) 97 F (36.1 C)   Ht 5\' 9"  (1.753 m)   Wt 230 lb (104.3 kg)   BMI 33.97 kg/m   Body mass index is 33.97 kg/m. General appearance: Well-developed well-nourished no gross deformities  2) Cardiovascular normal pulse and perfusion in the lower extremities normal color without edema  3) Neurologically deep tendon reflexes are equal and normal, no sensation loss or deficits no pathologic reflexes  4) Psychological: Awake alert and oriented x3 mood and affect normal  5) Skin no lacerations or ulcerations no nodularity no palpable  masses, no erythema or nodularity  6) Musculoskeletal:  Left knee is in varus he has medial joint line tenderness mild no effusion flexion is good strength is normal joint is stable to stress testing  Right knee is also in varus no effusion minimal tenderness medial joint line normal range of motion and strength   MEDICAL DECISION SECTION:  Encounter Diagnosis  Name Primary?  . Left knee pain, unspecified chronicity Yes    Imaging See report x-ray shows medial joint space narrowing severe with secondary bone changes in 6 degrees of varus  Plan:  (Rx., Inj.,  surg., Frx, MRI/CT, XR:2) Meds ordered this encounter  Medications  . meloxicam (MOBIC) 7.5 MG tablet    Sig: Take 1 tablet (7.5 mg total) by mouth daily.    Dispense:  30 tablet    Refill:  5   [name] he was advised he will need a knee replacement he does not want to have an injection so we changed him from Advil to meloxicam  He will call us when his symptoms warrant   8:46 AM Arther Abbott, MD  07/29/2019

## 2019-08-08 ENCOUNTER — Encounter (HOSPITAL_COMMUNITY): Payer: Self-pay | Admitting: Emergency Medicine

## 2019-08-08 ENCOUNTER — Emergency Department (HOSPITAL_COMMUNITY)
Admission: EM | Admit: 2019-08-08 | Discharge: 2019-08-08 | Disposition: A | Discharge status: Home / Self Care | Payer: Medicare HMO | Attending: Emergency Medicine | Admitting: Emergency Medicine

## 2019-08-08 ENCOUNTER — Other Ambulatory Visit: Payer: Self-pay

## 2019-08-08 DIAGNOSIS — I10 Essential (primary) hypertension: Secondary | ICD-10-CM | POA: Diagnosis not present

## 2019-08-08 DIAGNOSIS — K0889 Other specified disorders of teeth and supporting structures: Secondary | ICD-10-CM | POA: Diagnosis not present

## 2019-08-08 DIAGNOSIS — F17210 Nicotine dependence, cigarettes, uncomplicated: Secondary | ICD-10-CM | POA: Insufficient documentation

## 2019-08-08 DIAGNOSIS — Z7982 Long term (current) use of aspirin: Secondary | ICD-10-CM | POA: Diagnosis not present

## 2019-08-08 DIAGNOSIS — Z79899 Other long term (current) drug therapy: Secondary | ICD-10-CM | POA: Insufficient documentation

## 2019-08-08 MED ORDER — CLINDAMYCIN HCL 300 MG PO CAPS: 300.0000 mg | ORAL_CAPSULE | Freq: Three times a day (TID) | ORAL | 0 refills | Status: DC

## 2019-08-08 MED ORDER — CLINDAMYCIN PHOSPHATE 600 MG/50ML IV SOLN
600.0000 mg | Freq: Once | INTRAVENOUS | Status: AC
  Administered 2019-08-08: 12:00:00 600 mg via INTRAVENOUS
  Filled 2019-08-08: qty 50

## 2019-08-08 MED ORDER — HYDROCODONE-ACETAMINOPHEN 5-325 MG PO TABS: ORAL_TABLET | ORAL | 0 refills | Status: DC

## 2019-08-08 NOTE — Discharge Instructions (Addendum)
Frequent warm salt water rinses. Take the antibiotic as directed until its finished. You will need to follow-up with your dentist this week.

## 2019-08-08 NOTE — ED Provider Notes (Signed)
[facility] EMERGENCY DEPARTMENT Provider Note   CSN: [id number] Arrival date & time: 08/08/19  1053     History   Chief Complaint Chief Complaint  Patient presents with  . Dental Pain    HPI Mark Walter is a 63 y.o. male.     HPI   Mark Walter is a 63 y.o. male who presents to the Emergency Department complaining of dental pain gradually worsening for several days.  Pain began after a portion of his right lower tooth broke off while eating.  He woke with swelling to right lower face and chin area.  He describes a throbbing pain that radiates toward his ear.  Pain is worse with chewing.  He denies neck pain, difficulty swallowing or breathing, fever, chills.  Pain has not been relieved by OTC pain relievers.    Past Medical History:  Diagnosis Date  . Brain injury [facility]) age 49   states was unconscious for 3 days  . Closed fracture of left distal radius   . Hyperlipemia   . Hypertension   . Pre-diabetes   . Tobacco abuse     Patient Active Problem List   Diagnosis Date Noted  . Hallux valgus of right foot 11/13/2017  . Erectile dysfunction 01/03/2016  . Obesity, unspecified 01/03/2016  . Arthralgia of both knees 10/09/2015  . Essential hypertension, benign 09/12/2015  . Prediabetes 09/12/2015  . Hyperlipidemia 09/12/2015  . Cigarette nicotine dependence, uncomplicated 0000000  . Tinea pedis of both feet 09/12/2015  . KNEE, ARTHRITIS, DEGEN./OSTEO 06/02/2008  . JOINT EFFUSION, LEFT KNEE 06/02/2008    Past Surgical History:  Procedure Laterality Date  . CARPAL TUNNEL RELEASE Left 05/27/2017   Procedure: LEFT CARPAL TUNNEL RELEASE;  Surgeon: Charlotte Crumb, MD;  Location: [place];  Service: Orthopedics;  Laterality: Left;  . NO PAST SURGERIES    . OPEN REDUCTION INTERNAL FIXATION (ORIF) DISTAL RADIAL FRACTURE Left 05/27/2017   Procedure: OPEN REDUCTION INTERNAL FIXATION (ORIF) LEFT DISTAL RADIAL FRACTURE;  Surgeon: Charlotte Crumb, MD;  Location: [place];  Service: Orthopedics;  Laterality: Left;        Home Medications    Prior to Admission medications   Medication Sig Start Date End Date Taking? Authorizing Provider  aspirin EC 81 MG tablet Take 81 mg by mouth daily.    [provider]  atorvastatin (LIPITOR) 40 MG tablet Take 1 tablet (40 mg total) by mouth daily. 10/10/17   Soyla Dryer, PA-C  erythromycin ophthalmic ointment Place a 1/2 inch ribbon of ointment into the right lower eyelid.  Use 4 times a day for 5 days. 06/07/19   Joy, Shawn C, PA-C  HYDROcodone-acetaminophen (NORCO/VICODIN) 5-325 MG tablet Take 1 tablet by mouth every 4 (four) hours as needed. Patient not taking: Reported on 06/07/2019 09/22/18   Evalee Jefferson, PA-C  ibuprofen (ADVIL,MOTRIN) 600 MG tablet Take 1 tablet (600 mg total) by mouth every 8 (eight) hours as needed. 12/10/17   Soyla Dryer, PA-C  lisinopril (PRINIVIL,ZESTRIL) 10 MG tablet Take 1 tablet (10 mg total) by mouth daily. 02/05/18   Soyla Dryer, PA-C  meloxicam (MOBIC) 7.5 MG tablet Take 1 tablet (7.5 mg total) by mouth daily. 07/29/19   Carole Civil, MD  metoprolol (LOPRESSOR) 50 MG tablet Take 1 tablet (50 mg total) by mouth 2 (two) times daily. 08/08/16   Soyla Dryer, PA-C  predniSONE (DELTASONE) 10 MG tablet Take 6 tablets day one, 5 tablets day two, 4 tablets day  three, 3 tablets day four, 2 tablets day five, then 1 tablet day six Patient not taking: Reported on 07/29/2019 09/22/18   Evalee Jefferson, PA-C  sildenafil (VIAGRA) 100 MG tablet Take 0.5-1 tablets (50-100 mg total) by mouth daily as needed for erectile dysfunction. Patient not taking: Reported on 12/10/2017 05/20/17   Soyla Dryer, PA-C    Family History Family History  Problem Relation Age of Onset  . Cancer Father   . Diabetes Sister   . Diabetes Brother     Social History Social History   Tobacco Use  . Smoking status: Current Some Day Smoker     Packs/day: 0.25    Years: 39.00    Pack years: 9.75    Types: Cigarettes    Start date: 01/04/1974  . Smokeless tobacco: Never Used  Substance Use Topics  . Alcohol use: Yes    Alcohol/week: 0.0 standard drinks    Comment: weekly  . Drug use: No     Allergies   Pollen extract   Review of Systems Review of Systems  Constitutional: Negative for appetite change and fever.  HENT: Positive for dental problem and facial swelling. Negative for congestion, sore throat and trouble swallowing.   Eyes: Negative for pain and visual disturbance.  Gastrointestinal: Negative for nausea and vomiting.  Musculoskeletal: Negative for neck pain and neck stiffness.  Skin: Negative for rash.  Neurological: Negative for dizziness, facial asymmetry and headaches.  Hematological: Negative for adenopathy.     Physical Exam Updated Vital Signs BP (!) 161/91 (BP Location: Right Arm)   Pulse 63   Temp 98.9 F (37.2 C) (Oral)   Resp 16   Ht 5\' 9"  (1.753 m)   Wt 93 kg   SpO2 99%   BMI 30.27 kg/m   Physical Exam Vitals signs and nursing note reviewed.  Constitutional:      General: He is not in acute distress.    Appearance: He is well-developed.  HENT:     Head: Normocephalic and atraumatic.     Jaw: No trismus.     Right Ear: Tympanic membrane and ear canal normal.     Left Ear: Tympanic membrane and ear canal normal.     Mouth/Throat:     Dentition: Dental caries present. No dental abscesses.     Pharynx: Uvula midline. No uvula swelling.     Comments: Multiple dental caries with tenderness to palpation of the left lower lateral incisor. Mild to moderate left lower facial edema is noted. There is tenderness along the gingiva of the incisors and premolars. No trismus, uvula is midline and nonedematous. Neck:     Musculoskeletal: Normal range of motion and neck supple.  Cardiovascular:     Rate and Rhythm: Normal rate and regular rhythm.     Heart sounds: Normal heart sounds. No murmur.   Pulmonary:     Effort: Pulmonary effort is normal.     Breath sounds: Normal breath sounds.  Musculoskeletal: Normal range of motion.  Lymphadenopathy:     Cervical: No cervical adenopathy.  Skin:    General: Skin is warm and dry.  Neurological:     Mental Status: He is alert and oriented to person, place, and time.     Motor: No abnormal muscle tone.     Coordination: Coordination normal.      ED Treatments / Results  Labs (all labs ordered are listed, but only abnormal results are displayed) Labs Reviewed - No data to display  EKG None  Radiology No results found.  Procedures Procedures (including critical care time)  Medications Ordered in ED Medications  clindamycin (CLEOCIN) IVPB 600 mg (0 mg Intravenous Stopped 08/08/19 1243)     Initial Impression / Assessment and Plan / ED Course  I have reviewed the triage vital signs and the nursing notes.  Pertinent labs & imaging results that were available during my care of the patient were reviewed by me and considered in my medical decision making (see chart for details).       Pt with focal edema of the right lower face and chin.  Airway patent, he is non toxic appearing.  No trismus.  Vitals reviewed.  Likely periapical abscess.  No concerning sx's for Ludwig's angina.  No drainable abscess at this time.  IV Clindamycin given here, rx written for po clindamycin and pt agrees to close dental f/u next week.     Final Clinical Impressions(s) / ED Diagnoses   Final diagnoses:  Pain, dental    ED Discharge Orders    None       Kem Parkinson, PA-C 08/09/19 0830    Hayden Rasmussen, MD 08/09/19 1731

## 2019-08-08 NOTE — ED Triage Notes (Signed)
Pt states that he has a tooth ache on the lower right hand side

## 2019-08-08 NOTE — ED Notes (Signed)
Broke lower R tooth biting into fried fatback on Thanksgiving  Has no dentist   Here for dental referral as well as pain relief

## 2020-01-06 ENCOUNTER — Encounter (HOSPITAL_COMMUNITY): Payer: Self-pay | Admitting: Emergency Medicine

## 2020-01-06 ENCOUNTER — Other Ambulatory Visit: Payer: Self-pay

## 2020-01-06 ENCOUNTER — Emergency Department (HOSPITAL_COMMUNITY)
Admission: EM | Admit: 2020-01-06 | Discharge: 2020-01-06 | Disposition: A | Discharge status: Home / Self Care | Payer: Medicare Other | Attending: Emergency Medicine | Admitting: Emergency Medicine

## 2020-01-06 DIAGNOSIS — Z79899 Other long term (current) drug therapy: Secondary | ICD-10-CM | POA: Insufficient documentation

## 2020-01-06 DIAGNOSIS — R7303 Prediabetes: Secondary | ICD-10-CM | POA: Insufficient documentation

## 2020-01-06 DIAGNOSIS — F17210 Nicotine dependence, cigarettes, uncomplicated: Secondary | ICD-10-CM | POA: Diagnosis not present

## 2020-01-06 DIAGNOSIS — I10 Essential (primary) hypertension: Secondary | ICD-10-CM | POA: Diagnosis not present

## 2020-01-06 DIAGNOSIS — M109 Gout, unspecified: Secondary | ICD-10-CM | POA: Insufficient documentation

## 2020-01-06 DIAGNOSIS — Z7982 Long term (current) use of aspirin: Secondary | ICD-10-CM | POA: Diagnosis not present

## 2020-01-06 DIAGNOSIS — M79676 Pain in unspecified toe(s): Secondary | ICD-10-CM | POA: Diagnosis present

## 2020-01-06 DIAGNOSIS — Z87820 Personal history of traumatic brain injury: Secondary | ICD-10-CM | POA: Diagnosis not present

## 2020-01-06 MED ORDER — PREDNISONE 10 MG (21) PO TBPK: ORAL_TABLET | Freq: Every day | ORAL | 0 refills | Status: DC

## 2020-01-06 NOTE — Discharge Instructions (Addendum)
Take prednisone as directed.   Please follow up with your primary care provider within 5-7 days for re-evaluation of your symptoms. If you do not have a primary care provider, information for a healthcare clinic has been provided for you to make arrangements for follow up care. Please return to the emergency department for any new or worsening symptoms.  

## 2020-01-06 NOTE — ED Triage Notes (Signed)
Bilateral toe pain since Sunday afternoon, hx of gout

## 2020-01-06 NOTE — ED Provider Notes (Signed)
[facility] EMERGENCY DEPARTMENT Provider Note   CSN: [id number] Arrival date & time: 01/06/20  1420     History Chief Complaint  Patient presents with  . Toe Pain    Mark Walter is a 64 y.o. male.  HPI   64 year old male with a history of brain injury, hyperlipidemia, hypertension, tobacco abuse, who presents to the emergency department today for evaluation of bilateral toe pain.  Has pain to the bilateral MTPs.  States it is painful to touch.  Pain is constant and severe in nature.  He does have a history of gout.  This feels similar.  He does admit that he was drinking beer the day before symptoms started.  He denies any consumption of fish, red meats.  He denies any fevers or systemic symptoms.  Past Medical History:  Diagnosis Date  . Brain injury [facility]) age 26   states was unconscious for 3 days  . Closed fracture of left distal radius   . Hyperlipemia   . Hypertension   . Pre-diabetes   . Tobacco abuse     Patient Active Problem List   Diagnosis Date Noted  . Hallux valgus of right foot 11/13/2017  . Erectile dysfunction 01/03/2016  . Obesity, unspecified 01/03/2016  . Arthralgia of both knees 10/09/2015  . Essential hypertension, benign 09/12/2015  . Prediabetes 09/12/2015  . Hyperlipidemia 09/12/2015  . Cigarette nicotine dependence, uncomplicated 0000000  . Tinea pedis of both feet 09/12/2015  . KNEE, ARTHRITIS, DEGEN./OSTEO 06/02/2008  . JOINT EFFUSION, LEFT KNEE 06/02/2008    Past Surgical History:  Procedure Laterality Date  . CARPAL TUNNEL RELEASE Left 05/27/2017   Procedure: LEFT CARPAL TUNNEL RELEASE;  Surgeon: Charlotte Crumb, MD;  Location: [employer];  Service: Orthopedics;  Laterality: Left;  . NO PAST SURGERIES    . OPEN REDUCTION INTERNAL FIXATION (ORIF) DISTAL RADIAL FRACTURE Left 05/27/2017   Procedure: OPEN REDUCTION INTERNAL FIXATION (ORIF) LEFT DISTAL RADIAL FRACTURE;  Surgeon: Charlotte Crumb, MD;  Location: [place];  Service: Orthopedics;  Laterality: Left;       Family History  Problem Relation Age of Onset  . Cancer Father   . Diabetes Sister   . Diabetes Brother     Social History   Tobacco Use  . Smoking status: Current Some Day Smoker    Packs/day: 0.25    Years: 39.00    Pack years: 9.75    Types: Cigarettes    Start date: 01/04/1974  . Smokeless tobacco: Never Used  Substance Use Topics  . Alcohol use: Yes    Alcohol/week: 0.0 standard drinks    Comment: weekly  . Drug use: No    Home Medications Prior to Admission medications   Medication Sig Start Date End Date Taking? Authorizing Provider  aspirin EC 81 MG tablet Take 81 mg by mouth daily.    [provider]  atorvastatin (LIPITOR) 40 MG tablet Take 1 tablet (40 mg total) by mouth daily. 10/10/17   Soyla Dryer, PA-C  clindamycin (CLEOCIN) 300 MG capsule Take 1 capsule (300 mg total) by mouth 3 (three) times daily. 08/08/19   Triplett, Tammy, PA-C  erythromycin ophthalmic ointment Place a 1/2 inch ribbon of ointment into the right lower eyelid.  Use 4 times a day for 5 days. 06/07/19   Joy, Helane Gunther, PA-C  HYDROcodone-acetaminophen (NORCO/VICODIN) 5-325 MG tablet Take one tab po q 4 hrs prn pain 08/08/19   Triplett, Tammy, PA-C  ibuprofen (ADVIL,MOTRIN) 600 MG  tablet Take 1 tablet (600 mg total) by mouth every 8 (eight) hours as needed. 12/10/17   Soyla Dryer, PA-C  lisinopril (PRINIVIL,ZESTRIL) 10 MG tablet Take 1 tablet (10 mg total) by mouth daily. 02/05/18   Soyla Dryer, PA-C  meloxicam (MOBIC) 7.5 MG tablet Take 1 tablet (7.5 mg total) by mouth daily. 07/29/19   Carole Civil, MD  metoprolol (LOPRESSOR) 50 MG tablet Take 1 tablet (50 mg total) by mouth 2 (two) times daily. 08/08/16   Soyla Dryer, PA-C  predniSONE (STERAPRED UNI-PAK 21 TAB) 10 MG (21) TBPK tablet Take by mouth daily. Take 6 tabs by mouth daily  for 2 days, then 5 tabs for 2 days, then 4 tabs for 2 days, then 3  tabs for 2 days, 2 tabs for 2 days, then 1 tab by mouth daily for 2 days 01/06/20   Lashan Macias S, PA-C  sildenafil (VIAGRA) 100 MG tablet Take 0.5-1 tablets (50-100 mg total) by mouth daily as needed for erectile dysfunction. Patient not taking: Reported on 12/10/2017 05/20/17   Soyla Dryer, PA-C    Allergies    Pollen extract  Review of Systems   Review of Systems  Musculoskeletal:       Bilat toe pain  Skin: Negative for wound.  Neurological: Negative for weakness and numbness.    Physical Exam Updated Vital Signs BP 120/64 (BP Location: Right Arm)   Pulse 69   Temp 98 F (36.7 C) (Oral)   Resp 17   Ht 5\' 9"  (1.753 m)   Wt 95.3 kg   SpO2 95%   BMI 31.01 kg/m   Physical Exam Constitutional:      General: He is not in acute distress.    Appearance: He is well-developed.  Eyes:     Conjunctiva/sclera: Conjunctivae normal.  Cardiovascular:     Rate and Rhythm: Normal rate.  Pulmonary:     Effort: Pulmonary effort is normal.  Musculoskeletal:     Comments: TTP to the bilat 1st MTP joints. Mild warmth and erythema to the bilat joints.   Skin:    General: Skin is warm and dry.  Neurological:     Mental Status: He is alert and oriented to person, place, and time.     ED Results / Procedures / Treatments   Labs (all labs ordered are listed, but only abnormal results are displayed) Labs Reviewed - No data to display  EKG None  Radiology No results found.  Procedures Procedures (including critical care time)  Medications Ordered in ED Medications - No data to display  ED Course  I have reviewed the triage vital signs and the nursing notes.  Pertinent labs & imaging results that were available during my care of the patient were reviewed by me and considered in my medical decision making (see chart for details).    MDM Rules/Calculators/A&P                     Pt presents with bilat toe pain, swelling and erythema.  Pt is afebrile and stable. Has  h/o gout. Was drinking beer prior to the onset of symptoms. no evidence of occult fracture or injury. will place pt and steroid taper. Discussed that pt should respond to treatment with in 24 hour of begining treatment & likely resolve in 2-3 days. Advised on return precautions. He voices understanding of the plan and reasons to return. All questions answered, pt stable for discharge.   Final Clinical Impression(s) / ED  Diagnoses Final diagnoses:  Acute gout involving toe, unspecified cause, unspecified laterality    Rx / DC Orders ED Discharge Orders         Ordered    predniSONE (STERAPRED UNI-PAK 21 TAB) 10 MG (21) TBPK tablet  Daily     01/06/20 1534           Aveon Colquhoun, [place], PA-C 01/06/20 1534    Carmin Muskrat, MD 01/11/20 1433

## 2020-01-08 DIAGNOSIS — F17210 Nicotine dependence, cigarettes, uncomplicated: Secondary | ICD-10-CM | POA: Diagnosis not present

## 2020-01-08 DIAGNOSIS — M109 Gout, unspecified: Secondary | ICD-10-CM | POA: Diagnosis not present

## 2020-01-08 DIAGNOSIS — Z720 Tobacco use: Secondary | ICD-10-CM | POA: Diagnosis not present

## 2020-04-27 ENCOUNTER — Emergency Department (HOSPITAL_COMMUNITY): Payer: Medicare Other

## 2020-04-27 ENCOUNTER — Emergency Department (HOSPITAL_COMMUNITY)
Admission: EM | Admit: 2020-04-27 | Discharge: 2020-04-27 | Disposition: A | Discharge status: Home / Self Care | Payer: Medicare Other | Attending: Emergency Medicine | Admitting: Emergency Medicine

## 2020-04-27 ENCOUNTER — Encounter (HOSPITAL_COMMUNITY): Payer: Self-pay

## 2020-04-27 ENCOUNTER — Other Ambulatory Visit: Payer: Self-pay

## 2020-04-27 DIAGNOSIS — I10 Essential (primary) hypertension: Secondary | ICD-10-CM | POA: Insufficient documentation

## 2020-04-27 DIAGNOSIS — J358 Other chronic diseases of tonsils and adenoids: Secondary | ICD-10-CM

## 2020-04-27 DIAGNOSIS — F17210 Nicotine dependence, cigarettes, uncomplicated: Secondary | ICD-10-CM | POA: Insufficient documentation

## 2020-04-27 DIAGNOSIS — Z79899 Other long term (current) drug therapy: Secondary | ICD-10-CM | POA: Diagnosis not present

## 2020-04-27 DIAGNOSIS — J36 Peritonsillar abscess: Secondary | ICD-10-CM | POA: Diagnosis not present

## 2020-04-27 DIAGNOSIS — R221 Localized swelling, mass and lump, neck: Secondary | ICD-10-CM | POA: Insufficient documentation

## 2020-04-27 DIAGNOSIS — J351 Hypertrophy of tonsils: Secondary | ICD-10-CM | POA: Diagnosis not present

## 2020-04-27 DIAGNOSIS — E041 Nontoxic single thyroid nodule: Secondary | ICD-10-CM | POA: Diagnosis not present

## 2020-04-27 DIAGNOSIS — R070 Pain in throat: Secondary | ICD-10-CM | POA: Diagnosis present

## 2020-04-27 DIAGNOSIS — J392 Other diseases of pharynx: Secondary | ICD-10-CM | POA: Diagnosis not present

## 2020-04-27 DIAGNOSIS — D170 Benign lipomatous neoplasm of skin and subcutaneous tissue of head, face and neck: Secondary | ICD-10-CM | POA: Diagnosis not present

## 2020-04-27 DIAGNOSIS — I6523 Occlusion and stenosis of bilateral carotid arteries: Secondary | ICD-10-CM | POA: Diagnosis not present

## 2020-04-27 LAB — BASIC METABOLIC PANEL
Anion gap: 9 (ref 5–15)
BUN: 10 mg/dL (ref 8–23)
CO2: 22 mmol/L (ref 22–32)
Calcium: 9.4 mg/dL (ref 8.9–10.3)
Chloride: 105 mmol/L (ref 98–111)
Creatinine, Ser: 0.84 mg/dL (ref 0.61–1.24)
GFR calc Af Amer: >60 mL/min (ref >60)
GFR calc non Af Amer: >60 mL/min (ref >60)
Glucose, Bld: 105 mg/dL — ABNORMAL HIGH (ref 70–99)
Potassium: 4.1 mmol/L (ref 3.5–5.1)
Sodium: 136 mmol/L (ref 135–145)

## 2020-04-27 LAB — CBC WITH DIFFERENTIAL/PLATELET
Abs Immature Granulocytes: 0.02 10*3/uL (ref 0.00–0.07)
Basophils Absolute: 0 10*3/uL (ref 0.0–0.1)
Basophils Relative: 0 %
Eosinophils Absolute: 0 10*3/uL (ref 0.0–0.5)
Eosinophils Relative: 0 %
HCT: 41.3 % (ref 39.0–52.0)
Hemoglobin: 13.7 g/dL (ref 13.0–17.0)
Immature Granulocytes: 0 %
Lymphocytes Relative: 23 %
Lymphs Abs: 2.3 10*3/uL (ref 0.7–4.0)
MCH: 31.9 pg (ref 26.0–34.0)
MCHC: 33.2 g/dL (ref 30.0–36.0)
MCV: 96 fL (ref 80.0–100.0)
Monocytes Absolute: 1 10*3/uL (ref 0.1–1.0)
Monocytes Relative: 10 %
Neutro Abs: 6.4 10*3/uL (ref 1.7–7.7)
Neutrophils Relative %: 67 %
Platelets: 235 10*3/uL (ref 150–400)
RBC: 4.3 MIL/uL (ref 4.22–5.81)
RDW: 14.3 % (ref 11.5–15.5)
WBC: 9.7 10*3/uL (ref 4.0–10.5)
nRBC: 0 % (ref 0.0–0.2)

## 2020-04-27 LAB — GROUP A STREP BY PCR: Group A Strep by PCR: NOT DETECTED

## 2020-04-27 MED ORDER — SODIUM CHLORIDE 0.9 % IV SOLN: INTRAVENOUS | Status: DC

## 2020-04-27 MED ORDER — PREDNISONE 10 MG PO TABS
40.0000 mg | ORAL_TABLET | Freq: Every day | ORAL | 0 refills | Status: DC
Start: 2020-04-27 — End: 2020-11-26

## 2020-04-27 MED ORDER — ACETAMINOPHEN 325 MG PO TABS
650.0000 mg | ORAL_TABLET | Freq: Once | ORAL | Status: AC
  Administered 2020-04-27: 650 mg via ORAL
  Filled 2020-04-27: qty 2

## 2020-04-27 MED ORDER — IOHEXOL 300 MG/ML  SOLN
75.0000 mL | Freq: Once | INTRAMUSCULAR | Status: AC | PRN
  Administered 2020-04-27: 75 mL via INTRAVENOUS

## 2020-04-27 MED ORDER — TRAMADOL HCL 50 MG PO TABS: 50.0000 mg | ORAL_TABLET | Freq: Four times a day (QID) | ORAL | 0 refills | Status: DC | PRN

## 2020-04-27 MED ORDER — PREDNISONE 50 MG PO TABS
60.0000 mg | ORAL_TABLET | Freq: Once | ORAL | Status: AC
  Administered 2020-04-27: 60 mg via ORAL
  Filled 2020-04-27: qty 1

## 2020-04-27 MED ORDER — CLINDAMYCIN HCL 300 MG PO CAPS
300.0000 mg | ORAL_CAPSULE | Freq: Three times a day (TID) | ORAL | 0 refills | Status: DC
Start: 2020-04-27 — End: 2022-10-01

## 2020-04-27 NOTE — ED Provider Notes (Signed)
[facility] EMERGENCY DEPARTMENT Provider Note   CSN: [id number] Arrival date & time: 04/27/20  0830     History Chief Complaint  Patient presents with  . Dysphagia    Mark Walter is a 64 y.o. male.  With a complaint of some swelling and throat pain since Sunday.  Has swelling right-sided neck and some difficulty swallowing.  Most of the discomfort has resolved but still has some.  Is not made worse by putting any food in his mouth.  Denies any fevers cough or congestion.  Or any history of similar findings.        Past Medical History:  Diagnosis Date  . 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 Closed fracture of left distal radius   . Hyperlipemia   . Hypertension   . Pre-diabetes   . Tobacco abuse     Patient Active Problem List   Diagnosis Date Noted  . Hallux valgus of right foot 11/13/2017  . Erectile dysfunction 01/03/2016  . Obesity, unspecified 01/03/2016  . Arthralgia of both knees 10/09/2015  . Essential hypertension, benign 09/12/2015  . Prediabetes 09/12/2015  . Hyperlipidemia 09/12/2015  . Cigarette nicotine dependence, uncomplicated 40/98/1191  . Tinea pedis of both feet 09/12/2015  . KNEE, ARTHRITIS, DEGEN./OSTEO 06/02/2008  . JOINT EFFUSION, LEFT KNEE 06/02/2008    Past Surgical History:  Procedure Laterality Date  . CARPAL TUNNEL RELEASE Left 05/27/2017   Procedure: LEFT CARPAL TUNNEL RELEASE;  Surgeon: Charlotte Crumb, MD;  Location: [place];  Service: Orthopedics;  Laterality: Left;  . NO PAST SURGERIES    . OPEN REDUCTION INTERNAL FIXATION (ORIF) DISTAL RADIAL FRACTURE Left 05/27/2017   Procedure: OPEN REDUCTION INTERNAL FIXATION (ORIF) LEFT DISTAL RADIAL FRACTURE;  Surgeon: Charlotte Crumb, MD;  Location: [place];  Service: Orthopedics;  Laterality: Left;       Family History  Problem Relation Age of Onset  . Cancer Father   . Diabetes Sister   . Diabetes Brother       Social History   Tobacco Use  . Smoking status: Current Some Day Smoker    Packs/day: 0.25    Years: 39.00    Pack years: 9.75    Types: Cigarettes    Start date: 01/04/1974  . Smokeless tobacco: Never Used  Vaping Use  . Vaping Use: Never used  Substance Use Topics  . Alcohol use: Yes    Alcohol/week: 0.0 standard drinks    Comment: weekly  . Drug use: No    Home Medications Prior to Admission medications   Medication Sig Start Date End Date Taking? Authorizing Provider  aspirin EC 81 MG tablet Take 81 mg by mouth daily.    [provider]  atorvastatin (LIPITOR) 40 MG tablet Take 1 tablet (40 mg total) by mouth daily. 10/10/17   Soyla Dryer, PA-C  clindamycin (CLEOCIN) 300 MG capsule Take 1 capsule (300 mg total) by mouth 3 (three) times daily. 08/08/19   Triplett, Tammy, PA-C  clindamycin (CLEOCIN) 300 MG capsule Take 1 capsule (300 mg total) by mouth 3 (three) times daily. 04/27/20   Fredia Sorrow, MD  erythromycin ophthalmic ointment Place a 1/2 inch ribbon of ointment into the right lower eyelid.  Use 4 times a day for 5 days. 06/07/19   Joy, Helane Gunther, PA-C  HYDROcodone-acetaminophen (NORCO/VICODIN) 5-325 MG tablet Take one tab po q 4 hrs prn pain 08/08/19   Triplett, Tammy, PA-C  ibuprofen (  ADVIL,MOTRIN) 600 MG tablet Take 1 tablet (600 mg total) by mouth every 8 (eight) hours as needed. 12/10/17   Soyla Dryer, PA-C  lisinopril (PRINIVIL,ZESTRIL) 10 MG tablet Take 1 tablet (10 mg total) by mouth daily. 02/05/18   Soyla Dryer, PA-C  meloxicam (MOBIC) 7.5 MG tablet Take 1 tablet (7.5 mg total) by mouth daily. 07/29/19   Carole Civil, MD  metoprolol (LOPRESSOR) 50 MG tablet Take 1 tablet (50 mg total) by mouth 2 (two) times daily. 08/08/16   Soyla Dryer, PA-C  predniSONE (DELTASONE) 10 MG tablet Take 4 tablets (40 mg total) by mouth daily. 04/27/20   Fredia Sorrow, MD  predniSONE (STERAPRED UNI-PAK 21 TAB) 10 MG (21) TBPK tablet Take by  mouth daily. Take 6 tabs by mouth daily  for 2 days, then 5 tabs for 2 days, then 4 tabs for 2 days, then 3 tabs for 2 days, 2 tabs for 2 days, then 1 tab by mouth daily for 2 days 01/06/20   Couture, Cortni S, PA-C  sildenafil (VIAGRA) 100 MG tablet Take 0.5-1 tablets (50-100 mg total) by mouth daily as needed for erectile dysfunction. Patient not taking: Reported on 12/10/2017 05/20/17   Soyla Dryer, PA-C  traMADol (ULTRAM) 50 MG tablet Take 1 tablet (50 mg total) by mouth every 6 (six) hours as needed. 04/27/20   Fredia Sorrow, MD    Allergies    Pollen extract  Review of Systems   Review of Systems  Constitutional: Negative for chills and fever.  HENT: Positive for sore throat and trouble swallowing. Negative for congestion and rhinorrhea.   Eyes: Negative for visual disturbance.  Respiratory: Negative for cough and shortness of breath.   Cardiovascular: Negative for chest pain and leg swelling.  Gastrointestinal: Negative for abdominal pain, diarrhea, nausea and vomiting.  Genitourinary: Negative for dysuria.  Musculoskeletal: Positive for neck pain. Negative for back pain and neck stiffness.  Skin: Negative for rash.  Neurological: Negative for dizziness, light-headedness and headaches.  Hematological: Does not bruise/bleed easily.  Psychiatric/Behavioral: Negative for confusion.    Physical Exam Updated Vital Signs BP 132/79 (BP Location: Left Arm)   Pulse 68   Temp 98.3 F (36.8 C) (Oral)   Resp 18   Ht 1.753 m (5\' 9" )   Wt 95.3 kg   SpO2 95%   BMI 31.01 kg/m   Physical Exam Vitals and nursing note reviewed.  Constitutional:      Appearance: Normal appearance. He is well-developed.  HENT:     Head: Normocephalic and atraumatic.     Mouth/Throat:     Pharynx: Posterior oropharyngeal erythema present. No oropharyngeal exudate.     Comments: Right soft palate is swollen.  With a little bit of erythema.  No exudate.  No swelling to the floor of the mouth.  There  is a lower molar with a significant cavity. Eyes:     Extraocular Movements: Extraocular movements intact.     Conjunctiva/sclera: Conjunctivae normal.     Pupils: Pupils are equal, round, and reactive to light.  Neck:     Comments: Neck with some slight submandibular swelling.  But no adenopathy.  Parotid gland is soft. Cardiovascular:     Rate and Rhythm: Normal rate and regular rhythm.     Heart sounds: No murmur heard.   Pulmonary:     Effort: Pulmonary effort is normal. No respiratory distress.     Breath sounds: Normal breath sounds.  Abdominal:     Palpations: Abdomen is soft.  Tenderness: There is no abdominal tenderness.  Musculoskeletal:        General: Normal range of motion.     Cervical back: Normal range of motion and neck supple. No rigidity or tenderness.  Lymphadenopathy:     Cervical: No cervical adenopathy.  Skin:    General: Skin is warm and dry.     Capillary Refill: Capillary refill takes less than 2 seconds.  Neurological:     General: No focal deficit present.     Mental Status: He is alert and oriented to person, place, and time.     Cranial Nerves: No cranial nerve deficit.     Sensory: No sensory deficit.     Motor: No weakness.     ED Results / Procedures / Treatments   Labs (all labs ordered are listed, but only abnormal results are displayed) Labs Reviewed  BASIC METABOLIC PANEL - Abnormal; Notable for the following components:      Result Value   Glucose, Bld 105 (*)    All other components within normal limits  GROUP A STREP BY PCR  CBC WITH DIFFERENTIAL/PLATELET    EKG None  Radiology CT Soft Tissue Neck W Contrast  Result Date: 04/27/2020 CLINICAL DATA:  Soft tissue mass, neck, no prior imaging; neck abscess, deep tissue; neck mass, initial workup; right submandibular swelling. Additional history provided: Patient reports right-sided neck swelling and difficulty swallowing after drinking limited on Sunday EXAM: CT NECK WITH  CONTRAST TECHNIQUE: Multidetector CT imaging of the neck was performed using the standard protocol following the bolus administration of intravenous contrast. CONTRAST:  40mL OMNIPAQUE IOHEXOL 300 MG/ML  SOLN COMPARISON:  Maxillofacial CT 06/07/2019, head CT 06/07/2019. FINDINGS: Pharynx and larynx: There is poor dentition with multiple absent carious teeth. There is asymmetric prominence of the right palatine tonsil. Additionally, there is apparent mucosal/submucosal edema within the right oropharynx and hypopharynx extending inferiorly to the level of the right piriform sinus. Associated effacement of the right piriform sinus (for instance as seen on series 3, image 64). Salivary glands: No inflammation, mass, or stone. Thyroid: 2.7 cm right thyroid lobe nodule. Lymph nodes: Mildly enlarged right level II lymph node measuring 13 mm in short axis (series 3, image 45). No pathologically enlarged cervical chain lymph nodes are identified elsewhere within the neck. Vascular: The major vascular structures of the neck are patent. Minimal calcified atherosclerotic plaque within the visualized aortic arch and carotid arteries. Limited intracranial: No acute intracranial abnormality identified Visualized orbits: Largely excluded from the field of view. Chronic medially displaced fracture deformity of the left lamina papyracea. Mastoids and visualized paranasal sinuses: Minimal scattered paranasal sinus mucosal thickening at the imaged levels. No significant mastoid effusion. Skeleton: No acute bony abnormality or aggressive osseous lesion. Cervical spondylosis with multilevel disc space narrowing, posterior disc osteophytes, uncovertebral and facet hypertrophy. No high-grade bony spinal canal stenosis. Prominent C3-C4 ventral osteophyte. Upper chest: No consolidation within the imaged lung apices. Minimal paraseptal emphysema. Other: Incidentally noted 4.1 x 2.8 cm subcutaneous lipoma within the midline suboccipital scalp  slightly eccentric to the right (series 3, image 18). IMPRESSION: There is asymmetric prominence of the right palatine tonsil. Additionally, there is mucosal/submucosal edema within the right oropharynx and hypopharynx extending inferiorly to the level of the piriform sinus. Associated effacement of the right piriformis sinus. These findings are nonspecific and could reflect tonsillitis/pharyngitis or neoplasm. ENT consultation and direct visualization recommended. Mildly enlarged right level II lymph node measuring 13 mm in short axis. This could be  reactive in etiology or could reflect nodal metastatic disease. 1.3 cm right thyroid lobe nodule. Nonemergent thyroid ultrasound is recommended for further evaluation. Incidentally noted 4.1 cm subcutaneous lipoma within the midline suboccipital scalp. Minimal paranasal sinus mucosal thickening. Cervical spondylosis as described. Electronically Signed   By: Kellie Simmering DO   On: 04/27/2020 10:51    Procedures Procedures (including critical care time)  Medications Ordered in ED Medications  0.9 %  sodium chloride infusion ( Intravenous Stopped 04/27/20 1524)  iohexol (OMNIPAQUE) 300 MG/ML solution 75 mL (75 mLs Intravenous Contrast Given 04/27/20 1020)  predniSONE (DELTASONE) tablet 60 mg (60 mg Oral Given 04/27/20 1427)  acetaminophen (TYLENOL) tablet 650 mg (650 mg Oral Given 04/27/20 1523)    ED Course  I have reviewed the triage vital signs and the nursing notes.  Pertinent labs & imaging results that were available during my care of the patient were reviewed by me and considered in my medical decision making (see chart for details).    MDM Rules/Calculators/A&P                          Concern was for possible neck mass or perhaps even a salivary gland infection.  But after examining the oropharynx carefully there is soft palate swelling.  More so to the right.  Uvula without significant midline shift.   Based on this labs were done white blood  cell count was normal.  Strep was tested it was negative.  And CT Neck Soft Tissue with Contrast Was Done.  These soft tissue neck seems to be consistent may be with a tonsillar type mass no evidence of any peritonsillar abscess.  We will treat patient with antibiotics steroids have him follow-up with ear nose and throat.  Again the rapid strep was negative.  Patient will also be treated with mild pain medicine tramadol.  Received some Tylenol here before going home.  Patient understands the importance of following up with ear nose and throat.   Final Clinical Impression(s) / ED Diagnoses Final diagnoses:  Peritonsillar cellulitis  Tonsillar mass    Rx / DC Orders ED Discharge Orders         Ordered    predniSONE (DELTASONE) 10 MG tablet  Daily     Discontinue  Reprint     04/27/20 1451    traMADol (ULTRAM) 50 MG tablet  Every 6 hours PRN     Discontinue  Reprint     04/27/20 1451    clindamycin (CLEOCIN) 300 MG capsule  3 times daily     Discontinue  Reprint     04/27/20 1451           Fredia Sorrow, MD 04/27/20 1559

## 2020-04-27 NOTE — Discharge Instructions (Signed)
Take the antibiotic as directed.  Take the prednisone as directed.  Call ear nose and throat for follow-up.  Very important that you follow-up with them.  Return for any new or worse symptoms.

## 2020-04-27 NOTE — ED Triage Notes (Signed)
Pt reports drank some lemonade Sunday and started having swelling in r side of neck and difficulty swallowing.  Reports area hurts when he swallows.

## 2020-09-02 ENCOUNTER — Other Ambulatory Visit: Payer: Self-pay

## 2020-09-02 ENCOUNTER — Encounter (HOSPITAL_COMMUNITY): Payer: Self-pay | Admitting: Emergency Medicine

## 2020-09-02 ENCOUNTER — Emergency Department (HOSPITAL_COMMUNITY)
Admission: EM | Admit: 2020-09-02 | Discharge: 2020-09-02 | Disposition: A | Discharge status: Home / Self Care | Payer: Medicare Other | Attending: Emergency Medicine | Admitting: Emergency Medicine

## 2020-09-02 ENCOUNTER — Emergency Department (HOSPITAL_COMMUNITY): Payer: Medicare Other

## 2020-09-02 DIAGNOSIS — I10 Essential (primary) hypertension: Secondary | ICD-10-CM | POA: Diagnosis not present

## 2020-09-02 DIAGNOSIS — F17210 Nicotine dependence, cigarettes, uncomplicated: Secondary | ICD-10-CM | POA: Insufficient documentation

## 2020-09-02 DIAGNOSIS — L980 Pyogenic granuloma: Secondary | ICD-10-CM | POA: Diagnosis not present

## 2020-09-02 DIAGNOSIS — L02612 Cutaneous abscess of left foot: Secondary | ICD-10-CM | POA: Diagnosis not present

## 2020-09-02 DIAGNOSIS — L02416 Cutaneous abscess of left lower limb: Secondary | ICD-10-CM | POA: Insufficient documentation

## 2020-09-02 DIAGNOSIS — L989 Disorder of the skin and subcutaneous tissue, unspecified: Secondary | ICD-10-CM | POA: Diagnosis not present

## 2020-09-02 DIAGNOSIS — Z79899 Other long term (current) drug therapy: Secondary | ICD-10-CM | POA: Diagnosis not present

## 2020-09-02 DIAGNOSIS — R2242 Localized swelling, mass and lump, left lower limb: Secondary | ICD-10-CM | POA: Diagnosis not present

## 2020-09-02 DIAGNOSIS — M79672 Pain in left foot: Secondary | ICD-10-CM | POA: Diagnosis present

## 2020-09-02 MED ORDER — CIPROFLOXACIN HCL 500 MG PO TABS
500.0000 mg | ORAL_TABLET | Freq: Two times a day (BID) | ORAL | 0 refills | Status: DC
Start: 2020-09-02 — End: 2022-09-08

## 2020-09-02 NOTE — ED Triage Notes (Signed)
Pt c/o an abscess on the bottom of his left foot for the past 3 wks.

## 2020-09-02 NOTE — Discharge Instructions (Addendum)
Begin taking Cipro as prescribed.  Keep the area clean and dry, well padded and wrapped.  Follow-up with podiatry next week if symptoms are not improving.

## 2020-09-02 NOTE — ED Notes (Signed)
Pt in bed, dressing placed per md instructions, pt states that after wrapping his foot feels better, states that he is ready to go home

## 2020-09-02 NOTE — ED Provider Notes (Signed)
[facility] EMERGENCY DEPARTMENT Provider Note   CSN: [id number] Arrival date & time: 09/02/20  1343     History Chief Complaint  Patient presents with  . Abscess    TARUN VIEGAS is a 64 y.o. male.  Patient is a 64 year old male with past medical history of hypertension, hyperlipidemia, prediabetes.  He presents today for evaluation of left foot pain and swelling.  Patient states for the past 2 weeks he has had a sore area to the plantar surface of his left foot just proximal to his left fifth toe.  This has been increasing in size and becoming more uncomfortable.  He has tried poking it with a pin, however no pus has come out.  He denies fevers or chills.  The history is provided by the patient.       Past Medical History:  Diagnosis Date  . Brain injury [facility]) age 6   states was unconscious for 3 days  . Closed fracture of left distal radius   . Hyperlipemia   . Hypertension   . Pre-diabetes   . Tobacco abuse     Patient Active Problem List   Diagnosis Date Noted  . Hallux valgus of right foot 11/13/2017  . Erectile dysfunction 01/03/2016  . Obesity, unspecified 01/03/2016  . Arthralgia of both knees 10/09/2015  . Essential hypertension, benign 09/12/2015  . Prediabetes 09/12/2015  . Hyperlipidemia 09/12/2015  . Cigarette nicotine dependence, uncomplicated 0000000  . Tinea pedis of both feet 09/12/2015  . KNEE, ARTHRITIS, DEGEN./OSTEO 06/02/2008  . JOINT EFFUSION, LEFT KNEE 06/02/2008    Past Surgical History:  Procedure Laterality Date  . CARPAL TUNNEL RELEASE Left 05/27/2017   Procedure: LEFT CARPAL TUNNEL RELEASE;  Surgeon: Charlotte Crumb, MD;  Location: [place];  Service: Orthopedics;  Laterality: Left;  . NO PAST SURGERIES    . OPEN REDUCTION INTERNAL FIXATION (ORIF) DISTAL RADIAL FRACTURE Left 05/27/2017   Procedure: OPEN REDUCTION INTERNAL FIXATION (ORIF) LEFT DISTAL RADIAL FRACTURE;  Surgeon: Charlotte Crumb, MD;  Location:  [place];  Service: Orthopedics;  Laterality: Left;       Family History  Problem Relation Age of Onset  . Cancer Father   . Diabetes Sister   . Diabetes Brother     Social History   Tobacco Use  . Smoking status: Current Some Day Smoker    Packs/day: 0.25    Years: 39.00    Pack years: 9.75    Types: Cigarettes    Start date: 01/04/1974  . Smokeless tobacco: Never Used  Vaping Use  . Vaping Use: Never used  Substance Use Topics  . Alcohol use: Yes    Alcohol/week: 0.0 standard drinks    Comment: weekly  . Drug use: No    Home Medications Prior to Admission medications   Medication Sig Start Date End Date Taking? Authorizing Provider  aspirin EC 81 MG tablet Take 81 mg by mouth daily.    [provider]  atorvastatin (LIPITOR) 40 MG tablet Take 1 tablet (40 mg total) by mouth daily. 10/10/17   Soyla Dryer, PA-C  clindamycin (CLEOCIN) 300 MG capsule Take 1 capsule (300 mg total) by mouth 3 (three) times daily. 08/08/19   Triplett, Tammy, PA-C  clindamycin (CLEOCIN) 300 MG capsule Take 1 capsule (300 mg total) by mouth 3 (three) times daily. 04/27/20   Fredia Sorrow, MD  erythromycin ophthalmic ointment Place a 1/2 inch ribbon of ointment into the right lower eyelid.  Use 4 times  a day for 5 days. 06/07/19   Joy, Shawn C, PA-C  HYDROcodone-acetaminophen (NORCO/VICODIN) 5-325 MG tablet Take one tab po q 4 hrs prn pain 08/08/19   Triplett, Tammy, PA-C  ibuprofen (ADVIL,MOTRIN) 600 MG tablet Take 1 tablet (600 mg total) by mouth every 8 (eight) hours as needed. 12/10/17   Soyla Dryer, PA-C  lisinopril (PRINIVIL,ZESTRIL) 10 MG tablet Take 1 tablet (10 mg total) by mouth daily. 02/05/18   Soyla Dryer, PA-C  meloxicam (MOBIC) 7.5 MG tablet Take 1 tablet (7.5 mg total) by mouth daily. 07/29/19   Carole Civil, MD  metoprolol (LOPRESSOR) 50 MG tablet Take 1 tablet (50 mg total) by mouth 2 (two) times daily. 08/08/16   Soyla Dryer, PA-C   predniSONE (DELTASONE) 10 MG tablet Take 4 tablets (40 mg total) by mouth daily. 04/27/20   Fredia Sorrow, MD  predniSONE (STERAPRED UNI-PAK 21 TAB) 10 MG (21) TBPK tablet Take by mouth daily. Take 6 tabs by mouth daily  for 2 days, then 5 tabs for 2 days, then 4 tabs for 2 days, then 3 tabs for 2 days, 2 tabs for 2 days, then 1 tab by mouth daily for 2 days 01/06/20   Couture, Cortni S, PA-C  sildenafil (VIAGRA) 100 MG tablet Take 0.5-1 tablets (50-100 mg total) by mouth daily as needed for erectile dysfunction. Patient not taking: Reported on 12/10/2017 05/20/17   Soyla Dryer, PA-C  traMADol (ULTRAM) 50 MG tablet Take 1 tablet (50 mg total) by mouth every 6 (six) hours as needed. 04/27/20   Fredia Sorrow, MD    Allergies    Pollen extract  Review of Systems   Review of Systems  All other systems reviewed and are negative.   Physical Exam Updated Vital Signs BP (!) 146/73 (BP Location: Right Arm)   Pulse 62   Temp 97.9 F (36.6 C) (Oral)   Resp 18   Ht 5\' 8"  (1.727 m)   Wt 97.5 kg   SpO2 98%   BMI 32.69 kg/m   Physical Exam Vitals and nursing note reviewed.  Constitutional:      General: He is not in acute distress.    Appearance: Normal appearance. He is not ill-appearing.  HENT:     Head: Normocephalic and atraumatic.  Pulmonary:     Effort: Pulmonary effort is normal.  Skin:    General: Skin is warm and dry.     Comments: On the plantar surface of the left foot there is a 1.5 x 1 cm area of what appears to be granulation tissue.  This is located just proximal to the fifth toe.  There is mild surrounding inflammation.  Neurological:     Mental Status: He is alert.     ED Results / Procedures / Treatments   Labs (all labs ordered are listed, but only abnormal results are displayed) Labs Reviewed - No data to display  EKG None  Radiology DG Foot Complete Left  Result Date: 09/02/2020 CLINICAL DATA:  Abscess on bottom of foot. Infection adjacent to the  fifth digit. EXAM: LEFT FOOT - COMPLETE 3+ VIEW COMPARISON:  None. FINDINGS: There is skin thickening along the distal fifth digit. No evidence of subcutaneous gas. No osseous erosion of the fifth digit. IMPRESSION: 1. No evidence of osteomyelitis. 2. Soft tissue changes as described above. Electronically Signed   By: Suzy Bouchard M.D.   On: 09/02/2020 14:54    Procedures Procedures (including critical care time)  Medications Ordered in ED Medications -  No data to display  ED Course  I have reviewed the triage vital signs and the nursing notes.  Pertinent labs & imaging results that were available during my care of the patient were reviewed by me and considered in my medical decision making (see chart for details).    MDM Rules/Calculators/A&P  Patient has what appears to be a pyogenic granuloma to the bottom of his foot.  I did attempt to drain this in case it was a small abscess, however no pus was expressed.  Patient will be treated with antibiotics and I will have him follow-up with podiatry next week if not improving.  Patient is to also wear a padded dressing and keep the area clean and dry.  Final Clinical Impression(s) / ED Diagnoses Final diagnoses:  None    Rx / DC Orders ED Discharge Orders    None       Veryl Speak, MD 09/02/20 [phone]

## 2020-09-02 NOTE — ED Notes (Signed)
Pt to er room number one, pt has marble sized growth on the bottom of his L foot, states that he poked it with a pin and some blood came out.  No surrounding redness.

## 2020-09-05 ENCOUNTER — Encounter: Payer: Self-pay | Admitting: Podiatry

## 2020-09-05 ENCOUNTER — Ambulatory Visit (INDEPENDENT_AMBULATORY_CARE_PROVIDER_SITE_OTHER): Payer: Medicare Other

## 2020-09-05 ENCOUNTER — Ambulatory Visit (INDEPENDENT_AMBULATORY_CARE_PROVIDER_SITE_OTHER): Payer: Medicare Other | Admitting: Podiatry

## 2020-09-05 ENCOUNTER — Other Ambulatory Visit: Payer: Self-pay

## 2020-09-05 DIAGNOSIS — L858 Other specified epidermal thickening: Secondary | ICD-10-CM | POA: Diagnosis not present

## 2020-09-05 DIAGNOSIS — M7989 Other specified soft tissue disorders: Secondary | ICD-10-CM

## 2020-09-05 DIAGNOSIS — L97529 Non-pressure chronic ulcer of other part of left foot with unspecified severity: Secondary | ICD-10-CM | POA: Diagnosis not present

## 2020-09-05 DIAGNOSIS — L989 Disorder of the skin and subcutaneous tissue, unspecified: Secondary | ICD-10-CM

## 2020-09-05 DIAGNOSIS — L011 Impetiginization of other dermatoses: Secondary | ICD-10-CM | POA: Diagnosis not present

## 2020-09-05 DIAGNOSIS — L814 Other melanin hyperpigmentation: Secondary | ICD-10-CM | POA: Diagnosis not present

## 2020-09-05 DIAGNOSIS — D2371 Other benign neoplasm of skin of right lower limb, including hip: Secondary | ICD-10-CM | POA: Diagnosis not present

## 2020-09-05 DIAGNOSIS — L905 Scar conditions and fibrosis of skin: Secondary | ICD-10-CM | POA: Diagnosis not present

## 2020-09-05 MED ORDER — DOXYCYCLINE HYCLATE 100 MG PO TABS
100.0000 mg | ORAL_TABLET | Freq: Two times a day (BID) | ORAL | 0 refills | Status: DC
Start: 2020-09-05 — End: 2022-10-01

## 2020-09-05 NOTE — Progress Notes (Signed)
Subjective:  Patient ID: Mark Walter, male    DOB: 12-Feb-1956,  MRN: [id number]  Chief Complaint  Patient presents with  . Foot Pain    Pt believes he may have stepped on a nail mentioned there is a lot of pain when applying pressure- active bleeding- skin lifting- mentioned further evaluation     64 y.o. male presents with the above complaint.  Patient presents with complaint noted for soft tissue mass to the left submetatarsal 4.  Patient states is painful to walk on.  He has not been dealing with this for past few days.  He has does note done some Epsom salt soaks.  It is causing him some pain.  He is not a diabetic.  There are some bleeding and tenderness associated with it.  He would like to discuss treatment options he has not seen anyone else prior to seeing me.  He does not recall how this started.  He is currently on clindamycin that he was given from urgent care.   Review of Systems: Negative except as noted in the HPI. Denies N/V/F/Ch.  Past Medical History:  Diagnosis Date  . 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 Closed fracture of left distal radius   . Hyperlipemia   . Hypertension   . Pre-diabetes   . Tobacco abuse     Current Outpatient Medications:  .  aspirin 81 MG EC tablet, Take by mouth., Disp: , Rfl:  .  atorvastatin (LIPITOR) 20 MG tablet, Take by mouth., Disp: , Rfl:  .  doxycycline (VIBRA-TABS) 100 MG tablet, Take 1 tablet (100 mg total) by mouth 2 (two) times daily., Disp: 20 tablet, Rfl: 0 .  ibuprofen (ADVIL) 600 MG tablet, Take by mouth., Disp: , Rfl:  .  metoprolol tartrate (LOPRESSOR) 50 MG tablet, Take by mouth., Disp: , Rfl:  .  sildenafil (VIAGRA) 100 MG tablet, Take by mouth., Disp: , Rfl:  .  aspirin EC 81 MG tablet, Take 81 mg by mouth daily., Disp: , Rfl:  .  atorvastatin (LIPITOR) 40 MG tablet, Take 1 tablet (40 mg total) by mouth daily., Disp: 90 tablet, Rfl: 3 .  ciprofloxacin (CIPRO) 500 MG tablet, Take 1  tablet (500 mg total) by mouth 2 (two) times daily. One po bid x 7 days, Disp: 14 tablet, Rfl: 0 .  clindamycin (CLEOCIN) 300 MG capsule, Take 1 capsule (300 mg total) by mouth 3 (three) times daily., Disp: 21 capsule, Rfl: 0 .  clindamycin (CLEOCIN) 300 MG capsule, Take 1 capsule (300 mg total) by mouth 3 (three) times daily., Disp: 21 capsule, Rfl: 0 .  erythromycin ophthalmic ointment, Place a 1/2 inch ribbon of ointment into the right lower eyelid.  Use 4 times a day for 5 days., Disp: 1 g, Rfl: 0 .  HYDROcodone-acetaminophen (NORCO/VICODIN) 5-325 MG tablet, Take one tab po q 4 hrs prn pain, Disp: 8 tablet, Rfl: 0 .  ibuprofen (ADVIL,MOTRIN) 600 MG tablet, Take 1 tablet (600 mg total) by mouth every 8 (eight) hours as needed., Disp: 30 tablet, Rfl: 0 .  lisinopril (PRINIVIL,ZESTRIL) 10 MG tablet, Take 1 tablet (10 mg total) by mouth daily., Disp: 30 tablet, Rfl: 1 .  meloxicam (MOBIC) 7.5 MG tablet, Take 1 tablet (7.5 mg total) by mouth daily., Disp: 30 tablet, Rfl: 5 .  metoprolol (LOPRESSOR) 50 MG tablet, Take 1 tablet (50 mg total) by mouth 2 (two) times daily., Disp: 180 tablet, Rfl: 2 .  predniSONE (DELTASONE) 10 MG tablet, Take 4 tablets (40 mg total) by mouth daily., Disp: 20 tablet, Rfl: 0 .  predniSONE (STERAPRED UNI-PAK 21 TAB) 10 MG (21) TBPK tablet, Take by mouth daily. Take 6 tabs by mouth daily  for 2 days, then 5 tabs for 2 days, then 4 tabs for 2 days, then 3 tabs for 2 days, 2 tabs for 2 days, then 1 tab by mouth daily for 2 days, Disp: 42 tablet, Rfl: 0 .  sildenafil (VIAGRA) 100 MG tablet, Take 0.5-1 tablets (50-100 mg total) by mouth daily as needed for erectile dysfunction. (Patient not taking: Reported on 12/10/2017), Disp: 10 tablet, Rfl: 1 .  traMADol (ULTRAM) 50 MG tablet, Take 1 tablet (50 mg total) by mouth every 6 (six) hours as needed., Disp: 15 tablet, Rfl: 0  Social History   Tobacco Use  Smoking Status Current Some Day Smoker  . Packs/day: 0.25  . Years: 39.00  .  Pack years: 9.75  . Types: Cigarettes  . Start date: 01/04/1974  Smokeless Tobacco Never Used    Allergies  Allergen Reactions  . Pollen Extract Other (See Comments)    sneezing   Objective:  There were no vitals filed for this visit. There is no height or weight on file to calculate BMI. Constitutional Well developed. Well nourished.  Vascular Dorsalis pedis pulses palpable bilaterally. Posterior tibial pulses palpable bilaterally. Capillary refill normal to all digits.  No cyanosis or clubbing noted. Pedal hair growth normal.  Neurologic Normal speech. Oriented to person, place, and time. Epicritic sensation to light touch grossly present bilaterally.  Dermatologic Nails well groomed and normal in appearance. Soft tissue mass/benign skin lesion measuring 3.4 cm x 1 cm with a depth of 0.5 cm.  Pain on palpation to the lesion.  Even border noted.  No signs of malignancy noted.  No signs of melanoma noted. No skin lesions.  Orthopedic:  Manual muscle testing 5 out of 5   Radiographs: 3 views of skeletally mature adult left foot: No foreign body noted.  No soft tissue infection noted.  No soft tissue gas noted.  No osteomyelitis noted. Assessment:   1. Benign skin lesion   2. Soft tissue mass    Plan:  Patient was evaluated and treated and all questions answered.  Left foot submetatarsal 4 benign skin lesion/soft tissue mass -I explained to patient the etiology of the skin lesion various treatment options were extensively discussed.  Given the size of the lesions I am worried that it might be a component of cancer associated with it as well.  I discussed this with patient in extensive detail.  The procedure was carried out as detailed below -Excision of benign skin lesion: Betadine was used to prep the skin.  One-to-one mixture 1% lidocaine plain half percent Marcaine plain 15 cc was injected circumferentially in a V-block manner to anesthetize the field.  Using chisel blade to  handle the soft tissue mass was excised out completely down to the level of the deep tissue.  The mass was sent to pathology as sterile technique.  No complication noted.  The mass had an appearance of carcinoma versus severe case of plantar verruca.  I will await pathology to assess adequate resection of the mass.  The wound was dressed with 4 x 4 gauze Betadine, Ace bandage and Kerlix. -I instructed him to do Betadine wet-to-dry dressing changes daily.  He states understanding he will -Surgical shoe was dispensed for offloading   No follow-ups  on file.

## 2020-09-05 NOTE — Addendum Note (Signed)
Addended by: Francesca Oman on: 09/05/2020 01:50 PM   Modules accepted: Orders

## 2020-09-21 ENCOUNTER — Ambulatory Visit (INDEPENDENT_AMBULATORY_CARE_PROVIDER_SITE_OTHER): Payer: Medicare HMO | Admitting: Podiatry

## 2020-09-21 ENCOUNTER — Encounter: Payer: Self-pay | Admitting: Podiatry

## 2020-09-21 ENCOUNTER — Other Ambulatory Visit: Payer: Self-pay

## 2020-09-21 DIAGNOSIS — L989 Disorder of the skin and subcutaneous tissue, unspecified: Secondary | ICD-10-CM | POA: Diagnosis not present

## 2020-09-21 DIAGNOSIS — M7989 Other specified soft tissue disorders: Secondary | ICD-10-CM | POA: Diagnosis not present

## 2020-09-21 NOTE — Progress Notes (Signed)
Subjective:  Patient ID: Mark Walter, male    DOB: 08/17/1956,  MRN: [id number]  Chief Complaint  Patient presents with  . Foot Pain    Pt stated that he is doing well he has no concerns at this time    65 y.o. male presents with the above complaint.  Patient presents with a follow-up of soft tissue mass to the left submetatarsal 4 excision.  Patient states he is doing a lot better.  He has been doing Betadine wet-to-dry dressing changes been cleaning it dry.  He denies any other acute complaints he states is looking a lot better and his pain is a lot better   Review of Systems: Negative except as noted in the HPI. Denies N/V/F/Ch.  Past Medical History:  Diagnosis Date  . Brain injury [facility]) age 9   states was unconscious for 3 days  . Closed fracture of left distal radius   . Hyperlipemia   . Hypertension   . Pre-diabetes   . Tobacco abuse     Current Outpatient Medications:  .  aspirin 81 MG EC tablet, Take by mouth., Disp: , Rfl:  .  aspirin EC 81 MG tablet, Take 81 mg by mouth daily., Disp: , Rfl:  .  atorvastatin (LIPITOR) 20 MG tablet, Take by mouth., Disp: , Rfl:  .  atorvastatin (LIPITOR) 40 MG tablet, Take 1 tablet (40 mg total) by mouth daily., Disp: 90 tablet, Rfl: 3 .  ciprofloxacin (CIPRO) 500 MG tablet, Take 1 tablet (500 mg total) by mouth 2 (two) times daily. One po bid x 7 days, Disp: 14 tablet, Rfl: 0 .  clindamycin (CLEOCIN) 300 MG capsule, Take 1 capsule (300 mg total) by mouth 3 (three) times daily., Disp: 21 capsule, Rfl: 0 .  clindamycin (CLEOCIN) 300 MG capsule, Take 1 capsule (300 mg total) by mouth 3 (three) times daily., Disp: 21 capsule, Rfl: 0 .  doxycycline (VIBRA-TABS) 100 MG tablet, Take 1 tablet (100 mg total) by mouth 2 (two) times daily., Disp: 20 tablet, Rfl: 0 .  erythromycin ophthalmic ointment, Place a 1/2 inch ribbon of ointment into the right lower eyelid.  Use 4 times a day for 5 days., Disp: 1 g, Rfl: 0 .  HYDROcodone-acetaminophen  (NORCO/VICODIN) 5-325 MG tablet, Take one tab po q 4 hrs prn pain, Disp: 8 tablet, Rfl: 0 .  ibuprofen (ADVIL) 600 MG tablet, Take by mouth., Disp: , Rfl:  .  ibuprofen (ADVIL,MOTRIN) 600 MG tablet, Take 1 tablet (600 mg total) by mouth every 8 (eight) hours as needed., Disp: 30 tablet, Rfl: 0 .  lisinopril (PRINIVIL,ZESTRIL) 10 MG tablet, Take 1 tablet (10 mg total) by mouth daily., Disp: 30 tablet, Rfl: 1 .  meloxicam (MOBIC) 7.5 MG tablet, Take 1 tablet (7.5 mg total) by mouth daily., Disp: 30 tablet, Rfl: 5 .  metoprolol (LOPRESSOR) 50 MG tablet, Take 1 tablet (50 mg total) by mouth 2 (two) times daily., Disp: 180 tablet, Rfl: 2 .  metoprolol tartrate (LOPRESSOR) 50 MG tablet, Take by mouth., Disp: , Rfl:  .  predniSONE (DELTASONE) 10 MG tablet, Take 4 tablets (40 mg total) by mouth daily., Disp: 20 tablet, Rfl: 0 .  predniSONE (STERAPRED UNI-PAK 21 TAB) 10 MG (21) TBPK tablet, Take by mouth daily. Take 6 tabs by mouth daily  for 2 days, then 5 tabs for 2 days, then 4 tabs for 2 days, then 3 tabs for 2 days, 2 tabs for 2 days, then 1 tab by mouth  daily for 2 days, Disp: 42 tablet, Rfl: 0 .  sildenafil (VIAGRA) 100 MG tablet, Take 0.5-1 tablets (50-100 mg total) by mouth daily as needed for erectile dysfunction. (Patient not taking: Reported on 12/10/2017), Disp: 10 tablet, Rfl: 1 .  sildenafil (VIAGRA) 100 MG tablet, Take by mouth., Disp: , Rfl:  .  traMADol (ULTRAM) 50 MG tablet, Take 1 tablet (50 mg total) by mouth every 6 (six) hours as needed., Disp: 15 tablet, Rfl: 0  Social History   Tobacco Use  Smoking Status Current Some Day Smoker  . Packs/day: 0.25  . Years: 39.00  . Pack years: 9.75  . Types: Cigarettes  . Start date: 01/04/1974  Smokeless Tobacco Never Used    Allergies  Allergen Reactions  . Pollen Extract Other (See Comments)    sneezing   Objective:  There were no vitals filed for this visit. There is no height or weight on file to calculate BMI. Constitutional Well  developed. Well nourished.  Vascular Dorsalis pedis pulses palpable bilaterally. Posterior tibial pulses palpable bilaterally. Capillary refill normal to all digits.  No cyanosis or clubbing noted. Pedal hair growth normal.  Neurologic Normal speech. Oriented to person, place, and time. Epicritic sensation to light touch grossly present bilaterally.  Dermatologic Nails well groomed and normal in appearance. Soft tissue mass/benign skin lesion measuring 3.4 cm x 1 cm with a depth of 0.5 cm.  Pain on palpation to the lesion.  Even border noted.  No signs of malignancy noted.  No signs of melanoma noted. No skin lesions.  Orthopedic:  Manual muscle testing 5 out of 5   Radiographs: 3 views of skeletally mature adult left foot: No foreign body noted.  No soft tissue infection noted.  No soft tissue gas noted.  No osteomyelitis noted. Assessment:   1. Benign skin lesion   2. Soft tissue mass    Plan:  Patient was evaluated and treated and all questions answered.  Left foot submetatarsal 4 benign skin lesion/soft tissue mass -Clinically healed really well.  The skin has completely epithelialized.  We are still awaiting pathology report which hopefully will be done over the next 3 weeks.  Patient Began washing the foot and removing the dry skin/xerosis.  I discussed this with patient extensive detail.  Patient states away understanding -Awaiting pathology report   No follow-ups on file.

## 2020-09-22 ENCOUNTER — Telehealth: Payer: Self-pay | Admitting: Podiatry

## 2020-09-22 NOTE — Telephone Encounter (Signed)
[place] called in wanting to know where the specimens taking from, area of foot, each foot and locations and are the lesions are ulcerated and has requested call back, Please advise   Judeen Hammans [phone]

## 2020-09-28 ENCOUNTER — Encounter: Payer: Self-pay | Admitting: Podiatry

## 2020-10-12 ENCOUNTER — Ambulatory Visit: Payer: Medicare HMO | Admitting: Podiatry

## 2020-11-26 ENCOUNTER — Ambulatory Visit
Admission: EM | Admit: 2020-11-26 | Discharge: 2020-11-26 | Disposition: A | Discharge status: Home / Self Care | Payer: Medicaid Other | Attending: Emergency Medicine | Admitting: Emergency Medicine

## 2020-11-26 ENCOUNTER — Other Ambulatory Visit: Payer: Self-pay

## 2020-11-26 ENCOUNTER — Encounter: Payer: Self-pay | Admitting: Emergency Medicine

## 2020-11-26 ENCOUNTER — Ambulatory Visit (INDEPENDENT_AMBULATORY_CARE_PROVIDER_SITE_OTHER): Payer: Medicare HMO

## 2020-11-26 DIAGNOSIS — M25572 Pain in left ankle and joints of left foot: Secondary | ICD-10-CM

## 2020-11-26 DIAGNOSIS — S93492A Sprain of other ligament of left ankle, initial encounter: Secondary | ICD-10-CM

## 2020-11-26 DIAGNOSIS — M7989 Other specified soft tissue disorders: Secondary | ICD-10-CM | POA: Diagnosis not present

## 2020-11-26 DIAGNOSIS — S99912A Unspecified injury of left ankle, initial encounter: Secondary | ICD-10-CM

## 2020-11-26 MED ORDER — PREDNISONE 20 MG PO TABS: 20.0000 mg | ORAL_TABLET | Freq: Two times a day (BID) | ORAL | 0 refills | Status: AC

## 2020-11-26 NOTE — Discharge Instructions (Signed)
X-rays negative for fracture or dislocation Continue conservative management of rest, ice, and gentle stretches Ace bandage applied Prednisone prescribed Follow up with PCP if symptoms persist Return or go to the ER if you have any new or worsening symptoms (fever, chills, chest pain, redness, bruising, worsening symptoms despite medication, etc...)

## 2020-11-26 NOTE — ED Provider Notes (Signed)
[place]   [id number] 11/26/20 Arrival Time: 9381  CC: LT ankle pain/ injury  SUBJECTIVE: History from: patient. Mark Walter is a 65 y.o. male complains of LT ankle pain and injury x 3 days.  Missed step and fall walking down stairs.  Localizes the pain to the outside of ankle.  Describes the pain as intermittent and 8/10 in character.  Has tried OTC medications with minimal relief.  Symptoms are made worse with weight-bearing/ walking.  Denies similar symptoms in the past.  Complains of associated swelling.  Denies fever, chills, erythema, ecchymosis, weakness, numbness and tingling.  ROS: As per HPI.  All other pertinent ROS negative.     Past Medical History:  Diagnosis Date  . Brain injury [facility]) age 36   states was unconscious for 3 days  . Closed fracture of left distal radius   . Hyperlipemia   . Hypertension   . Pre-diabetes   . Tobacco abuse    Past Surgical History:  Procedure Laterality Date  . CARPAL TUNNEL RELEASE Left 05/27/2017   Procedure: LEFT CARPAL TUNNEL RELEASE;  Surgeon: Charlotte Crumb, MD;  Location: [facility];  Service: Orthopedics;  Laterality: Left;  . NO PAST SURGERIES    . OPEN REDUCTION INTERNAL FIXATION (ORIF) DISTAL RADIAL FRACTURE Left 05/27/2017   Procedure: OPEN REDUCTION INTERNAL FIXATION (ORIF) LEFT DISTAL RADIAL FRACTURE;  Surgeon: Charlotte Crumb, MD;  Location: [place];  Service: Orthopedics;  Laterality: Left;   Allergies  Allergen Reactions  . Pollen Extract Other (See Comments)    sneezing   No current facility-administered medications on file prior to encounter.   Current Outpatient Medications on File Prior to Encounter  Medication Sig Dispense Refill  . aspirin 81 MG EC tablet Take by mouth.    Marland Kitchen aspirin EC 81 MG tablet Take 81 mg by mouth daily.    Marland Kitchen atorvastatin (LIPITOR) 20 MG tablet Take by mouth.    Marland Kitchen atorvastatin (LIPITOR) 40 MG tablet Take 1 tablet (40 mg total) by  mouth daily. 90 tablet 3  . ciprofloxacin (CIPRO) 500 MG tablet Take 1 tablet (500 mg total) by mouth 2 (two) times daily. One po bid x 7 days 14 tablet 0  . clindamycin (CLEOCIN) 300 MG capsule Take 1 capsule (300 mg total) by mouth 3 (three) times daily. 21 capsule 0  . clindamycin (CLEOCIN) 300 MG capsule Take 1 capsule (300 mg total) by mouth 3 (three) times daily. 21 capsule 0  . doxycycline (VIBRA-TABS) 100 MG tablet Take 1 tablet (100 mg total) by mouth 2 (two) times daily. 20 tablet 0  . erythromycin ophthalmic ointment Place a 1/2 inch ribbon of ointment into the right lower eyelid.  Use 4 times a day for 5 days. 1 g 0  . HYDROcodone-acetaminophen (NORCO/VICODIN) 5-325 MG tablet Take one tab po q 4 hrs prn pain 8 tablet 0  . ibuprofen (ADVIL) 600 MG tablet Take by mouth.    Marland Kitchen ibuprofen (ADVIL,MOTRIN) 600 MG tablet Take 1 tablet (600 mg total) by mouth every 8 (eight) hours as needed. 30 tablet 0  . lisinopril (PRINIVIL,ZESTRIL) 10 MG tablet Take 1 tablet (10 mg total) by mouth daily. 30 tablet 1  . meloxicam (MOBIC) 7.5 MG tablet Take 1 tablet (7.5 mg total) by mouth daily. 30 tablet 5  . metoprolol (LOPRESSOR) 50 MG tablet Take 1 tablet (50 mg total) by mouth 2 (two) times daily. 180 tablet 2  . metoprolol tartrate (LOPRESSOR) 50  MG tablet Take by mouth.    . sildenafil (VIAGRA) 100 MG tablet Take 0.5-1 tablets (50-100 mg total) by mouth daily as needed for erectile dysfunction. (Patient not taking: Reported on 12/10/2017) 10 tablet 1  . sildenafil (VIAGRA) 100 MG tablet Take by mouth.    . traMADol (ULTRAM) 50 MG tablet Take 1 tablet (50 mg total) by mouth every 6 (six) hours as needed. 15 tablet 0   Social History   Socioeconomic History  . Marital status: Single    Spouse name: Not on file  . Number of children: Not on file  . Years of education: Not on file  . Highest education level: Not on file  Occupational History  . Not on file  Tobacco Use  . Smoking status: Current Some  Day Smoker    Packs/day: 0.25    Years: 39.00    Pack years: 9.75    Types: Cigarettes    Start date: 01/04/1974  . Smokeless tobacco: Never Used  Vaping Use  . Vaping Use: Never used  Substance and Sexual Activity  . Alcohol use: Yes    Alcohol/week: 0.0 standard drinks    Comment: weekly  . Drug use: No  . Sexual activity: Not on file  Other Topics Concern  . Not on file  Social History Narrative  . Not on file   Social Determinants of Health   Financial Resource Strain: Not on file  Food Insecurity: Not on file  Transportation Needs: Not on file  Physical Activity: Not on file  Stress: Not on file  Social Connections: Not on file  Intimate Partner Violence: Not on file   Family History  Problem Relation Age of Onset  . Cancer Father   . Diabetes Sister   . Diabetes Brother     OBJECTIVE:  Vitals:   11/26/20 1203  BP: 120/74  Pulse: 81  Resp: 18  Temp: 98.3 F (36.8 C)  TempSrc: Oral  SpO2: 95%    General appearance: ALERT; in no acute distress.  Head: NCAT Lungs: Normal respiratory effort CV: Dorsalis pedis pulse 2+ Musculoskeletal: LT ankle Inspection: Mild diffuse swelling about the lateral ankle Palpation: TTP over anterolateral ankle, NTTP over distal fibula/ tibia ROM: LROM Strength: deferred Skin: warm and dry Neurologic: Ambulates with minimal difficulty; Sensation intact about the upper/ lower extremities Psychological: alert and cooperative; normal mood and affect  DIAGNOSTIC STUDIES:  DG Ankle Complete Left  Result Date: 11/26/2020 CLINICAL DATA:  Fall with pain and swelling. EXAM: LEFT ANKLE COMPLETE - 3+ VIEW COMPARISON:  None. FINDINGS: There is no evidence of fracture, dislocation, or joint effusion. A plantar calcaneal enthesophyte is noted. Soft tissue swelling surrounds the ankle. IMPRESSION: No acute osseous injury. Electronically Signed   By: Zerita Boers M.D.   On: 11/26/2020 12:56    X-rays negative for bony abnormalities  including fracture, or dislocation.  No soft tissue swelling.    I have reviewed the x-rays myself and the radiologist interpretation. I am in agreement with the radiologist interpretation.     ASSESSMENT & PLAN:  1. Acute left ankle pain   2. Injury of left ankle, initial encounter   3. Sprain of anterior talofibular ligament of left ankle, initial encounter     Meds ordered this encounter  Medications  . predniSONE (DELTASONE) 20 MG tablet    Sig: Take 1 tablet (20 mg total) by mouth 2 (two) times daily with a meal for 5 days.    Dispense:  10  tablet    Refill:  0    Order Specific Question:   Supervising Provider    Answer:   Raylene Everts [[id number]]   X-rays negative for fracture or dislocation Continue conservative management of rest, ice, and gentle stretches Ace bandage applied Prednisone prescribed Follow up with PCP if symptoms persist Return or go to the ER if you have any new or worsening symptoms (fever, chills, chest pain, redness, bruising, worsening symptoms despite medication, etc...)   Reviewed expectations re: course of current medical issues. Questions answered. Outlined signs and symptoms indicating need for more acute intervention. Patient verbalized understanding. After Visit Summary given.    Lestine Box, PA-C 11/26/20 1306

## 2020-11-26 NOTE — ED Triage Notes (Signed)
Missed a step and fell a few days ago left ankle pain and swelling.

## 2020-12-30 DIAGNOSIS — Z720 Tobacco use: Secondary | ICD-10-CM | POA: Diagnosis not present

## 2020-12-30 DIAGNOSIS — Z0001 Encounter for general adult medical examination with abnormal findings: Secondary | ICD-10-CM | POA: Diagnosis not present

## 2020-12-30 DIAGNOSIS — F17210 Nicotine dependence, cigarettes, uncomplicated: Secondary | ICD-10-CM | POA: Diagnosis not present

## 2020-12-30 DIAGNOSIS — Z1389 Encounter for screening for other disorder: Secondary | ICD-10-CM | POA: Diagnosis not present

## 2020-12-30 DIAGNOSIS — M109 Gout, unspecified: Secondary | ICD-10-CM | POA: Diagnosis not present

## 2020-12-30 DIAGNOSIS — Z1331 Encounter for screening for depression: Secondary | ICD-10-CM | POA: Diagnosis not present

## 2021-01-11 ENCOUNTER — Encounter: Payer: Self-pay | Admitting: *Deleted

## 2021-02-01 ENCOUNTER — Other Ambulatory Visit: Payer: Self-pay

## 2021-02-01 ENCOUNTER — Other Ambulatory Visit (HOSPITAL_COMMUNITY)
Admission: RE | Admit: 2021-02-01 | Discharge: 2021-02-01 | Disposition: A | Discharge status: Home / Self Care | Payer: Medicare HMO | Source: Ambulatory Visit | Attending: Internal Medicine | Admitting: Internal Medicine

## 2021-02-01 DIAGNOSIS — I10 Essential (primary) hypertension: Secondary | ICD-10-CM | POA: Insufficient documentation

## 2021-02-01 DIAGNOSIS — E785 Hyperlipidemia, unspecified: Secondary | ICD-10-CM | POA: Diagnosis not present

## 2021-02-01 LAB — CBC WITH DIFFERENTIAL/PLATELET
Abs Immature Granulocytes: 0.01 10*3/uL (ref 0.00–0.07)
Basophils Absolute: 0 10*3/uL (ref 0.0–0.1)
Basophils Relative: 0 %
Eosinophils Absolute: 0.1 10*3/uL (ref 0.0–0.5)
Eosinophils Relative: 2 %
HCT: 43.3 % (ref 39.0–52.0)
Hemoglobin: 14.1 g/dL (ref 13.0–17.0)
Immature Granulocytes: 0 %
Lymphocytes Relative: 38 %
Lymphs Abs: 2.1 10*3/uL (ref 0.7–4.0)
MCH: 32.3 pg (ref 26.0–34.0)
MCHC: 32.6 g/dL (ref 30.0–36.0)
MCV: 99.1 fL (ref 80.0–100.0)
Monocytes Absolute: 0.4 10*3/uL (ref 0.1–1.0)
Monocytes Relative: 7 %
Neutro Abs: 2.9 10*3/uL (ref 1.7–7.7)
Neutrophils Relative %: 53 %
Platelets: 231 10*3/uL (ref 150–400)
RBC: 4.37 MIL/uL (ref 4.22–5.81)
RDW: 15.5 % (ref 11.5–15.5)
WBC: 5.5 10*3/uL (ref 4.0–10.5)
nRBC: 0 % (ref 0.0–0.2)

## 2021-02-01 LAB — HEPATIC FUNCTION PANEL
ALT: 19 U/L (ref 0–44)
AST: 18 U/L (ref 15–41)
Albumin: 3.7 g/dL (ref 3.5–5.0)
Alkaline Phosphatase: 59 U/L (ref 38–126)
Bilirubin, Direct: <0.1 mg/dL (ref 0.0–0.2)
Total Bilirubin: 0.4 mg/dL (ref 0.3–1.2)
Total Protein: 7 g/dL (ref 6.5–8.1)

## 2021-02-01 LAB — BASIC METABOLIC PANEL
Anion gap: 6 (ref 5–15)
BUN: 11 mg/dL (ref 8–23)
CO2: 27 mmol/L (ref 22–32)
Calcium: 9.3 mg/dL (ref 8.9–10.3)
Chloride: 106 mmol/L (ref 98–111)
Creatinine, Ser: 0.87 mg/dL (ref 0.61–1.24)
GFR, Estimated: >60 mL/min (ref >60)
Glucose, Bld: 104 mg/dL — ABNORMAL HIGH (ref 70–99)
Potassium: 4.2 mmol/L (ref 3.5–5.1)
Sodium: 139 mmol/L (ref 135–145)

## 2021-02-01 LAB — LIPID PANEL
Cholesterol: 258 mg/dL — ABNORMAL HIGH (ref 0–200)
HDL: 50 mg/dL (ref >40)
LDL Cholesterol: 174 mg/dL — ABNORMAL HIGH (ref 0–99)
Total CHOL/HDL Ratio: 5.2 RATIO
Triglycerides: 172 mg/dL — ABNORMAL HIGH (ref <150)
VLDL: 34 mg/dL (ref 0–40)

## 2021-02-01 LAB — URIC ACID: Uric Acid, Serum: 8.7 mg/dL — ABNORMAL HIGH (ref 3.7–8.6)

## 2021-02-14 ENCOUNTER — Ambulatory Visit: Payer: Medicare HMO

## 2021-06-09 DIAGNOSIS — M109 Gout, unspecified: Secondary | ICD-10-CM | POA: Diagnosis not present

## 2021-06-09 DIAGNOSIS — E785 Hyperlipidemia, unspecified: Secondary | ICD-10-CM | POA: Diagnosis not present

## 2021-06-09 DIAGNOSIS — F17200 Nicotine dependence, unspecified, uncomplicated: Secondary | ICD-10-CM | POA: Diagnosis not present

## 2021-06-09 DIAGNOSIS — R7303 Prediabetes: Secondary | ICD-10-CM | POA: Diagnosis not present

## 2021-06-12 DIAGNOSIS — M109 Gout, unspecified: Secondary | ICD-10-CM | POA: Diagnosis not present

## 2021-06-12 DIAGNOSIS — E785 Hyperlipidemia, unspecified: Secondary | ICD-10-CM | POA: Diagnosis not present

## 2021-06-12 DIAGNOSIS — R7303 Prediabetes: Secondary | ICD-10-CM | POA: Diagnosis not present

## 2021-06-13 DIAGNOSIS — M109 Gout, unspecified: Secondary | ICD-10-CM | POA: Diagnosis not present

## 2021-06-13 DIAGNOSIS — E785 Hyperlipidemia, unspecified: Secondary | ICD-10-CM | POA: Diagnosis not present

## 2021-06-13 DIAGNOSIS — R7303 Prediabetes: Secondary | ICD-10-CM | POA: Diagnosis not present

## 2021-06-23 DIAGNOSIS — Z23 Encounter for immunization: Secondary | ICD-10-CM | POA: Diagnosis not present

## 2021-10-01 ENCOUNTER — Emergency Department (HOSPITAL_COMMUNITY)
Admission: EM | Admit: 2021-10-01 | Discharge: 2021-10-01 | Disposition: A | Discharge status: Home / Self Care | Payer: Medicare Other | Attending: Emergency Medicine | Admitting: Emergency Medicine

## 2021-10-01 ENCOUNTER — Emergency Department (HOSPITAL_COMMUNITY): Payer: Medicare Other

## 2021-10-01 ENCOUNTER — Encounter (HOSPITAL_COMMUNITY): Payer: Self-pay | Admitting: Emergency Medicine

## 2021-10-01 ENCOUNTER — Other Ambulatory Visit: Payer: Self-pay

## 2021-10-01 DIAGNOSIS — Z7982 Long term (current) use of aspirin: Secondary | ICD-10-CM | POA: Diagnosis not present

## 2021-10-01 DIAGNOSIS — Z720 Tobacco use: Secondary | ICD-10-CM | POA: Diagnosis not present

## 2021-10-01 DIAGNOSIS — I10 Essential (primary) hypertension: Secondary | ICD-10-CM | POA: Insufficient documentation

## 2021-10-01 DIAGNOSIS — M1712 Unilateral primary osteoarthritis, left knee: Secondary | ICD-10-CM | POA: Insufficient documentation

## 2021-10-01 DIAGNOSIS — Z79899 Other long term (current) drug therapy: Secondary | ICD-10-CM | POA: Diagnosis not present

## 2021-10-01 DIAGNOSIS — M25562 Pain in left knee: Secondary | ICD-10-CM

## 2021-10-01 MED ORDER — MELOXICAM 7.5 MG PO TABS: 7.5000 mg | ORAL_TABLET | Freq: Every day | ORAL | 0 refills | Status: AC

## 2021-10-01 MED ORDER — KETOROLAC TROMETHAMINE 60 MG/2ML IM SOLN
30.0000 mg | Freq: Once | INTRAMUSCULAR | Status: AC
  Administered 2021-10-01: 30 mg via INTRAMUSCULAR
  Filled 2021-10-01: qty 2

## 2021-10-01 NOTE — ED Provider Notes (Signed)
[facility] EMERGENCY DEPARTMENT Provider Note   CSN: [id number] Arrival date & time: 10/01/21  4403     History  Chief Complaint  Patient presents with   Knee Pain    Mark Walter is a 66 y.o. male.  HPI Patient presents for left knee pain.  Medical history is notable for TBI, HLD, HTN, tobacco use, arthritis, and gout.  Pain is located in the lateral aspect of his left knee.  He denies any recent injuries.  He first noticed it approximately 2 days ago.  He has treated at home with Tylenol and Goody powder.  He has not taken anything for analgesia today.  Pain is absent at rest.  It is worsened with movements.  Patient has been able to ambulate.  He believes that he may have had an injury to that knee in the 80s but cannot recall which knee that was.  He denies any other previous orthopedic injuries.  He has not had any other areas of discomfort.  He has not had any systemic symptoms.    Home Medications Prior to Admission medications   Medication Sig Start Date End Date Taking? Authorizing Provider  aspirin 81 MG EC tablet Take by mouth. 09/23/14   [provider]  aspirin EC 81 MG tablet Take 81 mg by mouth daily.    [provider]  atorvastatin (LIPITOR) 20 MG tablet Take by mouth. 05/20/17   [provider]  atorvastatin (LIPITOR) 40 MG tablet Take 1 tablet (40 mg total) by mouth daily. 10/10/17   Soyla Dryer, PA-C  ciprofloxacin (CIPRO) 500 MG tablet Take 1 tablet (500 mg total) by mouth 2 (two) times daily. One po bid x 7 days 09/02/20   Veryl Speak, MD  clindamycin (CLEOCIN) 300 MG capsule Take 1 capsule (300 mg total) by mouth 3 (three) times daily. 08/08/19   Triplett, Tammy, PA-C  clindamycin (CLEOCIN) 300 MG capsule Take 1 capsule (300 mg total) by mouth 3 (three) times daily. 04/27/20   Fredia Sorrow, MD  doxycycline (VIBRA-TABS) 100 MG tablet Take 1 tablet (100 mg total) by mouth 2 (two) times daily. 09/05/20   Felipa Furnace, DPM   erythromycin ophthalmic ointment Place a 1/2 inch ribbon of ointment into the right lower eyelid.  Use 4 times a day for 5 days. 06/07/19   Joy, Shawn C, PA-C  HYDROcodone-acetaminophen (NORCO/VICODIN) 5-325 MG tablet Take one tab po q 4 hrs prn pain 08/08/19   Triplett, Tammy, PA-C  ibuprofen (ADVIL,MOTRIN) 600 MG tablet Take 1 tablet (600 mg total) by mouth every 8 (eight) hours as needed. 12/10/17   Soyla Dryer, PA-C  lisinopril (PRINIVIL,ZESTRIL) 10 MG tablet Take 1 tablet (10 mg total) by mouth daily. 02/05/18   Soyla Dryer, PA-C  meloxicam (MOBIC) 7.5 MG tablet Take 1 tablet (7.5 mg total) by mouth daily for 10 days. 10/01/21 10/11/21  Godfrey Pick, MD  metoprolol (LOPRESSOR) 50 MG tablet Take 1 tablet (50 mg total) by mouth 2 (two) times daily. 08/08/16   Soyla Dryer, PA-C  metoprolol tartrate (LOPRESSOR) 50 MG tablet Take by mouth. 08/08/16   [provider]  sildenafil (VIAGRA) 100 MG tablet Take 0.5-1 tablets (50-100 mg total) by mouth daily as needed for erectile dysfunction. Patient not taking: Reported on 12/10/2017 05/20/17   Soyla Dryer, PA-C  sildenafil (VIAGRA) 100 MG tablet Take by mouth. 05/20/17   [provider]  traMADol (ULTRAM) 50 MG tablet Take 1 tablet (50 mg total) by mouth every  6 (six) hours as needed. 04/27/20   Fredia Sorrow, MD      Allergies    Pollen extract    Review of Systems   Review of Systems  Constitutional:  Negative for activity change, appetite change, chills, fatigue and fever.  HENT:  Negative for ear pain and sore throat.   Eyes:  Negative for pain and visual disturbance.  Respiratory:  Negative for cough and shortness of breath.   Cardiovascular:  Negative for chest pain and palpitations.  Gastrointestinal:  Negative for abdominal pain, nausea and vomiting.  Genitourinary:  Negative for dysuria, flank pain and hematuria.  Musculoskeletal:  Positive for arthralgias. Negative for back pain, gait problem, joint  swelling, myalgias and neck pain.  Skin:  Negative for color change and rash.  Neurological:  Negative for dizziness, seizures, syncope, weakness and numbness.  All other systems reviewed and are negative.  Physical Exam Updated Vital Signs BP 138/86 (BP Location: Left Arm)    Pulse 77    Resp 18    Ht 5\' 8"  (1.727 m)    Wt 99.8 kg    SpO2 98%    BMI 33.45 kg/m  Physical Exam Vitals and nursing note reviewed.  Constitutional:      General: He is not in acute distress.    Appearance: Normal appearance. He is well-developed. He is not ill-appearing, toxic-appearing or diaphoretic.  HENT:     Head: Normocephalic and atraumatic.     Right Ear: External ear normal.     Left Ear: External ear normal.     Nose: Nose normal.  Eyes:     General: No scleral icterus.    Extraocular Movements: Extraocular movements intact.     Conjunctiva/sclera: Conjunctivae normal.  Cardiovascular:     Rate and Rhythm: Normal rate and regular rhythm.     Heart sounds: No murmur heard. Pulmonary:     Effort: Pulmonary effort is normal. No respiratory distress.  Abdominal:     General: There is no distension.     Palpations: Abdomen is soft.  Musculoskeletal:        General: Tenderness (slight) present. No swelling, deformity or signs of injury. Normal range of motion.     Cervical back: Normal range of motion and neck supple. No rigidity.     Right lower leg: No edema.     Left lower leg: No edema.  Skin:    General: Skin is warm and dry.     Capillary Refill: Capillary refill takes less than 2 seconds.     Coloration: Skin is not jaundiced or pale.  Neurological:     General: No focal deficit present.     Mental Status: He is alert and oriented to person, place, and time.     Cranial Nerves: No cranial nerve deficit.     Sensory: No sensory deficit.     Motor: No weakness.     Coordination: Coordination normal.  Psychiatric:        Mood and Affect: Mood normal.        Behavior: Behavior normal.         Thought Content: Thought content normal.        Judgment: Judgment normal.    ED Results / Procedures / Treatments   Labs (all labs ordered are listed, but only abnormal results are displayed) Labs Reviewed - No data to display  EKG None  Radiology DG Knee Complete 4 Views Left  Result Date: 10/01/2021 CLINICAL DATA:  Left  knee pain. EXAM: LEFT KNEE - COMPLETE 4+ VIEW COMPARISON:  07/29/2019 FINDINGS: Tiny suprapatellar joint effusion. No signs of acute fracture or dislocation. Severe tricompartment osteoarthritis noted with joint space narrowing and exuberant marginal spur formation involving all 3 compartments. Sharpening of the tibial spines noted. IMPRESSION: 1. No acute abnormality. 2. Severe tricompartment osteoarthritis. Electronically Signed   By: Kerby Moors M.D.   On: 10/01/2021 09:30    Procedures Procedures    Medications Ordered in ED Medications  ketorolac (TORADOL) injection 30 mg (30 mg Intramuscular Given 10/01/21 0932)    ED Course/ Medical Decision Making/ A&P                           Medical Decision Making Amount and/or Complexity of Data Reviewed Radiology: ordered.  Risk Prescription drug management.   Is a 64-year-old male presenting for 2 days of lateral left knee pain.  This is without any recent injuries.  He does not have pain at rest but does experience pain with certain movements of his left lower extremity.  He is ambulatory on arrival in the ED.  He is well-appearing on exam.  Knee is without any swelling, warmth, or erythema.  Active and passive range of motion is intact.  Patient has mild tenderness to the tibial insertion points of lateral tendons.  I do suspect tendinitis/bursitis.  X-ray imaging of her knee showed osteoarthritis.  He was given Toradol for analgesia and prescribed a 10-day course of Mobic.  He was advised to avoid combining other NSAIDs while taking Mobic.  Sports medicine contact information was provided for  follow-up.  Patient states that he does have a knee brace at home to use as needed for comfort.  He is stable for discharge at this time.        Final Clinical Impression(s) / ED Diagnoses Final diagnoses:  Acute pain of left knee    Rx / DC Orders ED Discharge Orders          Ordered    meloxicam (MOBIC) 7.5 MG tablet  Daily        10/01/21 0938              Godfrey Pick, MD 10/01/21 [phone]

## 2021-10-01 NOTE — ED Triage Notes (Signed)
Pt c/o L knee pain x 2 days. Pt stated he has taken tylenol for pain without much relief.

## 2021-10-01 NOTE — Discharge Instructions (Addendum)
There is a prescription for Mobic (meloxicam) sent to your pharmacy.  This is an NSAID medication.  Avoid taking this with other NSAIDs.  This includes Advil, Motrin, Aleve, or any generic ibuprofen or Naprosyn.  Avoid taking these medications while taking Mobic.  Call the number below to follow-up with sports medicine/orthopedic surgery.

## 2021-10-22 ENCOUNTER — Emergency Department (HOSPITAL_COMMUNITY)
Admission: EM | Admit: 2021-10-22 | Discharge: 2021-10-22 | Disposition: A | Discharge status: Home / Self Care | Payer: Medicare Other | Attending: Emergency Medicine | Admitting: Emergency Medicine

## 2021-10-22 ENCOUNTER — Emergency Department (HOSPITAL_COMMUNITY): Payer: Medicare Other

## 2021-10-22 ENCOUNTER — Encounter (HOSPITAL_COMMUNITY): Payer: Self-pay | Admitting: Emergency Medicine

## 2021-10-22 ENCOUNTER — Other Ambulatory Visit: Payer: Self-pay

## 2021-10-22 DIAGNOSIS — Z79899 Other long term (current) drug therapy: Secondary | ICD-10-CM | POA: Insufficient documentation

## 2021-10-22 DIAGNOSIS — Z7982 Long term (current) use of aspirin: Secondary | ICD-10-CM | POA: Insufficient documentation

## 2021-10-22 DIAGNOSIS — I10 Essential (primary) hypertension: Secondary | ICD-10-CM | POA: Diagnosis not present

## 2021-10-22 DIAGNOSIS — W19XXXA Unspecified fall, initial encounter: Secondary | ICD-10-CM

## 2021-10-22 DIAGNOSIS — S39012A Strain of muscle, fascia and tendon of lower back, initial encounter: Secondary | ICD-10-CM

## 2021-10-22 DIAGNOSIS — F17200 Nicotine dependence, unspecified, uncomplicated: Secondary | ICD-10-CM | POA: Insufficient documentation

## 2021-10-22 DIAGNOSIS — W11XXXA Fall on and from ladder, initial encounter: Secondary | ICD-10-CM | POA: Diagnosis not present

## 2021-10-22 DIAGNOSIS — M2578 Osteophyte, vertebrae: Secondary | ICD-10-CM | POA: Diagnosis not present

## 2021-10-22 DIAGNOSIS — S3992XA Unspecified injury of lower back, initial encounter: Secondary | ICD-10-CM | POA: Diagnosis present

## 2021-10-22 DIAGNOSIS — M545 Low back pain, unspecified: Secondary | ICD-10-CM | POA: Diagnosis not present

## 2021-10-22 MED ORDER — HYDROCODONE-ACETAMINOPHEN 5-325 MG PO TABS: 1.0000 | ORAL_TABLET | Freq: Four times a day (QID) | ORAL | 0 refills | Status: DC | PRN

## 2021-10-22 NOTE — ED Provider Notes (Addendum)
[facility] EMERGENCY DEPARTMENT Provider Note   CSN: [id number] Arrival date & time: 10/22/21  0831     History  Chief Complaint  Patient presents with   Mark Walter is a 66 y.o. male.  Patient fell about 8 feet off a ladder yesterday.  Complaint of low back pain.  Denies any numbness or weakness to his EXTR lower extremities.  Did not hit his head.  No neck pain no upper extremity injuries.  No complaint of any chest pain or abdominal pain.  No loss of consciousness.  Past medical history significant hyperlipidemia tobacco use prediabetic hypertension.      Home Medications Prior to Admission medications   Medication Sig Start Date End Date Taking? Authorizing Provider  aspirin 81 MG EC tablet Take by mouth. 09/23/14   [provider]  aspirin EC 81 MG tablet Take 81 mg by mouth daily.    [provider]  atorvastatin (LIPITOR) 20 MG tablet Take by mouth. 05/20/17   [provider]  atorvastatin (LIPITOR) 40 MG tablet Take 1 tablet (40 mg total) by mouth daily. 10/10/17   Soyla Dryer, PA-C  ciprofloxacin (CIPRO) 500 MG tablet Take 1 tablet (500 mg total) by mouth 2 (two) times daily. One po bid x 7 days 09/02/20   Veryl Speak, MD  clindamycin (CLEOCIN) 300 MG capsule Take 1 capsule (300 mg total) by mouth 3 (three) times daily. 08/08/19   Triplett, Tammy, PA-C  clindamycin (CLEOCIN) 300 MG capsule Take 1 capsule (300 mg total) by mouth 3 (three) times daily. 04/27/20   Fredia Sorrow, MD  doxycycline (VIBRA-TABS) 100 MG tablet Take 1 tablet (100 mg total) by mouth 2 (two) times daily. 09/05/20   Felipa Furnace, DPM  erythromycin ophthalmic ointment Place a 1/2 inch ribbon of ointment into the right lower eyelid.  Use 4 times a day for 5 days. 06/07/19   Joy, Shawn C, PA-C  HYDROcodone-acetaminophen (NORCO/VICODIN) 5-325 MG tablet Take one tab po q 4 hrs prn pain 08/08/19   Triplett, Tammy, PA-C  ibuprofen (ADVIL,MOTRIN) 600 MG tablet  Take 1 tablet (600 mg total) by mouth every 8 (eight) hours as needed. 12/10/17   Soyla Dryer, PA-C  lisinopril (PRINIVIL,ZESTRIL) 10 MG tablet Take 1 tablet (10 mg total) by mouth daily. 02/05/18   Soyla Dryer, PA-C  metoprolol (LOPRESSOR) 50 MG tablet Take 1 tablet (50 mg total) by mouth 2 (two) times daily. 08/08/16   Soyla Dryer, PA-C  metoprolol tartrate (LOPRESSOR) 50 MG tablet Take by mouth. 08/08/16   [provider]  sildenafil (VIAGRA) 100 MG tablet Take 0.5-1 tablets (50-100 mg total) by mouth daily as needed for erectile dysfunction. Patient not taking: Reported on 12/10/2017 05/20/17   Soyla Dryer, PA-C  sildenafil (VIAGRA) 100 MG tablet Take by mouth. 05/20/17   [provider]  traMADol (ULTRAM) 50 MG tablet Take 1 tablet (50 mg total) by mouth every 6 (six) hours as needed. 04/27/20   Fredia Sorrow, MD      Allergies    Pollen extract    Review of Systems   Review of Systems  Constitutional:  Negative for chills and fever.  HENT:  Negative for ear pain and sore throat.   Eyes:  Negative for pain and visual disturbance.  Respiratory:  Negative for cough and shortness of breath.   Cardiovascular:  Negative for chest pain and palpitations.  Gastrointestinal:  Negative for abdominal pain and vomiting.  Genitourinary:  Negative  for dysuria and hematuria.  Musculoskeletal:  Positive for back pain. Negative for arthralgias and neck pain.  Skin:  Negative for color change and rash.  Neurological:  Negative for seizures and syncope.  All other systems reviewed and are negative.  Physical Exam Updated Vital Signs BP (!) 142/79    Pulse 68    Temp 98.5 F (36.9 C) (Oral)    Resp 19    Ht 1.727 m (5\' 8" )    Wt 99.8 kg    SpO2 98%    BMI 33.45 kg/m  Physical Exam Vitals and nursing note reviewed.  Constitutional:      General: He is not in acute distress.    Appearance: Normal appearance. He is well-developed.  HENT:     Head: Normocephalic  and atraumatic.  Eyes:     Extraocular Movements: Extraocular movements intact.     Conjunctiva/sclera: Conjunctivae normal.     Pupils: Pupils are equal, round, and reactive to light.  Cardiovascular:     Rate and Rhythm: Normal rate and regular rhythm.     Heart sounds: No murmur heard. Pulmonary:     Effort: Pulmonary effort is normal. No respiratory distress.     Breath sounds: Normal breath sounds.  Abdominal:     Palpations: Abdomen is soft.     Tenderness: There is no abdominal tenderness.  Musculoskeletal:        General: Tenderness present. No swelling.     Cervical back: Normal range of motion and neck supple. No tenderness.     Comments: Tenderness to palpation lower lumbar area spreading out bilaterally.  Some soreness to the right ankle area.  But no significant swelling.  Good range of motion.  Skin:    General: Skin is warm and dry.     Capillary Refill: Capillary refill takes less than 2 seconds.  Neurological:     General: No focal deficit present.     Mental Status: He is alert and oriented to person, place, and time.     Cranial Nerves: No cranial nerve deficit.     Sensory: No sensory deficit.     Motor: No weakness.  Psychiatric:        Mood and Affect: Mood normal.    ED Results / Procedures / Treatments   Labs (all labs ordered are listed, but only abnormal results are displayed) Labs Reviewed - No data to display  EKG None  Radiology No results found.  Procedures Procedures    Medications Ordered in ED Medications - No data to display  ED Course/ Medical Decision Making/ A&P                           Medical Decision Making Amount and/or Complexity of Data Reviewed Radiology: ordered.  Risk Prescription drug management.   Fall from ladder yesterday.  Will get x-rays of the lumbar spine to rule out a fracture or compression fracture.  Patient without any other sick specific complaints.  On exam no evidence of any other significant  injury.  X-rays of the lumbar back without any fracture or dislocation.  But there are degenerative changes.  Patient could have aggravated a disc.  We will treat symptomatically with pain medication follow-up with primary care doctor.  If symptoms persist for 2 weeks then MRI would be appropriate.   Final Clinical Impression(s) / ED Diagnoses Final diagnoses:  Fall, initial encounter  Strain of lumbar region, initial encounter  Rx / DC Orders ED Discharge Orders     None         Fredia Sorrow, MD 10/22/21 8757    Fredia Sorrow, MD 10/22/21 1114

## 2021-10-22 NOTE — ED Triage Notes (Signed)
Pt to the ED with complaints of a fall off of a ladder yesterday. Pt states he did not hit his head and is not on blood thinners.  Pt states he has pain of the lower back.

## 2021-10-22 NOTE — ED Notes (Signed)
Patient transported to X-ray 

## 2021-10-22 NOTE — Discharge Instructions (Signed)
The hydrocodone as needed for pain.  X-rays of the lumbar back showed no acute bony abnormalities.  Radiologic degenerative changes.  This could have aggravated a disc in your back.  So if the symptoms do not improve over the next week MRI would be indicated.  Make an appointment to follow-up with your primary care doctor.

## 2022-07-02 ENCOUNTER — Other Ambulatory Visit (HOSPITAL_COMMUNITY): Payer: Self-pay | Admitting: Gerontology

## 2022-07-02 ENCOUNTER — Other Ambulatory Visit (HOSPITAL_COMMUNITY)
Admission: RE | Admit: 2022-07-02 | Discharge: 2022-07-02 | Disposition: A | Discharge status: Home / Self Care | Payer: Medicare Other | Source: Ambulatory Visit | Attending: Internal Medicine | Admitting: Internal Medicine

## 2022-07-02 ENCOUNTER — Ambulatory Visit (HOSPITAL_COMMUNITY)
Admission: RE | Admit: 2022-07-02 | Discharge: 2022-07-02 | Disposition: A | Discharge status: Home / Self Care | Payer: Medicare Other | Source: Ambulatory Visit | Attending: Gerontology | Admitting: Gerontology

## 2022-07-02 DIAGNOSIS — F17210 Nicotine dependence, cigarettes, uncomplicated: Secondary | ICD-10-CM | POA: Diagnosis not present

## 2022-07-02 DIAGNOSIS — M778 Other enthesopathies, not elsewhere classified: Secondary | ICD-10-CM | POA: Diagnosis not present

## 2022-07-02 DIAGNOSIS — M25521 Pain in right elbow: Secondary | ICD-10-CM | POA: Diagnosis not present

## 2022-07-02 DIAGNOSIS — Z0001 Encounter for general adult medical examination with abnormal findings: Secondary | ICD-10-CM | POA: Insufficient documentation

## 2022-07-02 DIAGNOSIS — Z23 Encounter for immunization: Secondary | ICD-10-CM | POA: Diagnosis not present

## 2022-07-02 DIAGNOSIS — I10 Essential (primary) hypertension: Secondary | ICD-10-CM | POA: Diagnosis not present

## 2022-07-02 DIAGNOSIS — R739 Hyperglycemia, unspecified: Secondary | ICD-10-CM | POA: Insufficient documentation

## 2022-07-02 DIAGNOSIS — E785 Hyperlipidemia, unspecified: Secondary | ICD-10-CM | POA: Diagnosis not present

## 2022-07-02 DIAGNOSIS — M7989 Other specified soft tissue disorders: Secondary | ICD-10-CM | POA: Diagnosis not present

## 2022-07-02 DIAGNOSIS — M109 Gout, unspecified: Secondary | ICD-10-CM | POA: Diagnosis not present

## 2022-07-02 DIAGNOSIS — M25421 Effusion, right elbow: Secondary | ICD-10-CM | POA: Diagnosis not present

## 2022-07-02 DIAGNOSIS — R7303 Prediabetes: Secondary | ICD-10-CM | POA: Diagnosis not present

## 2022-07-02 DIAGNOSIS — Z1389 Encounter for screening for other disorder: Secondary | ICD-10-CM | POA: Diagnosis not present

## 2022-07-02 LAB — CBC WITH DIFFERENTIAL/PLATELET
Abs Immature Granulocytes: 0.03 10*3/uL (ref 0.00–0.07)
Basophils Absolute: 0 10*3/uL (ref 0.0–0.1)
Basophils Relative: 0 %
Eosinophils Absolute: 0 10*3/uL (ref 0.0–0.5)
Eosinophils Relative: 0 %
HCT: 42.2 % (ref 39.0–52.0)
Hemoglobin: 14.2 g/dL (ref 13.0–17.0)
Immature Granulocytes: 0 %
Lymphocytes Relative: 22 %
Lymphs Abs: 2.2 10*3/uL (ref 0.7–4.0)
MCH: 32.4 pg (ref 26.0–34.0)
MCHC: 33.6 g/dL (ref 30.0–36.0)
MCV: 96.3 fL (ref 80.0–100.0)
Monocytes Absolute: 0.9 10*3/uL (ref 0.1–1.0)
Monocytes Relative: 9 %
Neutro Abs: 6.8 10*3/uL (ref 1.7–7.7)
Neutrophils Relative %: 69 %
Platelets: 227 10*3/uL (ref 150–400)
RBC: 4.38 MIL/uL (ref 4.22–5.81)
RDW: 14.9 % (ref 11.5–15.5)
WBC: 10 10*3/uL (ref 4.0–10.5)
nRBC: 0 % (ref 0.0–0.2)

## 2022-07-02 LAB — BASIC METABOLIC PANEL
Anion gap: 8 (ref 5–15)
BUN: 11 mg/dL (ref 8–23)
CO2: 25 mmol/L (ref 22–32)
Calcium: 9.7 mg/dL (ref 8.9–10.3)
Chloride: 103 mmol/L (ref 98–111)
Creatinine, Ser: 0.88 mg/dL (ref 0.61–1.24)
GFR, Estimated: >60 mL/min (ref >60)
Glucose, Bld: 103 mg/dL — ABNORMAL HIGH (ref 70–99)
Potassium: 4.5 mmol/L (ref 3.5–5.1)
Sodium: 136 mmol/L (ref 135–145)

## 2022-07-02 LAB — HEPATIC FUNCTION PANEL
ALT: 18 U/L (ref 0–44)
AST: 18 U/L (ref 15–41)
Albumin: 3.6 g/dL (ref 3.5–5.0)
Alkaline Phosphatase: 75 U/L (ref 38–126)
Bilirubin, Direct: 0.1 mg/dL (ref 0.0–0.2)
Indirect Bilirubin: 0.7 mg/dL (ref 0.3–0.9)
Total Bilirubin: 0.8 mg/dL (ref 0.3–1.2)
Total Protein: 7.8 g/dL (ref 6.5–8.1)

## 2022-07-02 LAB — LIPID PANEL
Cholesterol: 249 mg/dL — ABNORMAL HIGH (ref 0–200)
HDL: 62 mg/dL (ref >40)
LDL Cholesterol: 170 mg/dL — ABNORMAL HIGH (ref 0–99)
Total CHOL/HDL Ratio: 4 RATIO
Triglycerides: 86 mg/dL (ref <150)
VLDL: 17 mg/dL (ref 0–40)

## 2022-07-02 LAB — URIC ACID: Uric Acid, Serum: 7.6 mg/dL (ref 3.7–8.6)

## 2022-07-02 LAB — HEMOGLOBIN A1C
Hgb A1c MFr Bld: 6 % — ABNORMAL HIGH (ref 4.8–5.6)
Mean Plasma Glucose: 125.5 mg/dL

## 2022-07-10 ENCOUNTER — Other Ambulatory Visit (HOSPITAL_COMMUNITY): Payer: Self-pay | Admitting: Gerontology

## 2022-07-10 DIAGNOSIS — S42301A Unspecified fracture of shaft of humerus, right arm, initial encounter for closed fracture: Secondary | ICD-10-CM

## 2022-07-12 ENCOUNTER — Encounter: Payer: Self-pay | Admitting: Orthopaedic Surgery

## 2022-07-12 ENCOUNTER — Ambulatory Visit (INDEPENDENT_AMBULATORY_CARE_PROVIDER_SITE_OTHER): Payer: Medicare Other | Admitting: Orthopaedic Surgery

## 2022-07-12 VITALS — BP 176/100 | HR 63 | Ht 69.0 in | Wt 224.0 lb

## 2022-07-12 DIAGNOSIS — M25521 Pain in right elbow: Secondary | ICD-10-CM

## 2022-07-12 DIAGNOSIS — G8929 Other chronic pain: Secondary | ICD-10-CM

## 2022-07-12 DIAGNOSIS — M10021 Idiopathic gout, right elbow: Secondary | ICD-10-CM | POA: Diagnosis not present

## 2022-07-12 MED ORDER — PREDNISONE 5 MG (21) PO TBPK: ORAL_TABLET | ORAL | 0 refills | Status: DC

## 2022-07-12 NOTE — Progress Notes (Signed)
Subjective:    Patient ID: Mark Walter, male    DOB: [birth year], 66 y.o.   MRN: [id number]  HPI He has pain of the right elbow.  He was seen by Dr. Legrand Rams on 07-10-22 and referred here.  He has X-rays of the right elbow which showed: IMPRESSION: Suspected tiny avulsion fracture about the lateral condyle of the humerus with associated small elbow joint effusion. No additional fractures are identified with special attention paid to the radial head. Further evaluation with elbow CT could be performed as indicated.  I have reviewed the notes and the X-rays.  I have independently reviewed and interpreted x-rays of this patient done at another site by another physician or qualified health professional.  Dr. Legrand Rams also suspects gout and began allopurinol.  I will add prednisone dose pack to this.   Review of Systems  Constitutional:  Positive for activity change.  Musculoskeletal:  Positive for arthralgias and joint swelling.  All other systems reviewed and are negative. For Review of Systems, all other systems reviewed and are negative.  The following is a summary of the past history medically, past history surgically, known current medicines, social history and family history.  This information is gathered electronically by the computer from prior information and documentation.  I review this each visit and have found including this information at this point in the chart is beneficial and informative.   Past Medical History:  Diagnosis Date   Brain injury [facility]) age 78   states was unconscious for 3 days   Closed fracture of left distal radius    Hyperlipemia    Hypertension    Pre-diabetes    Tobacco abuse     Past Surgical History:  Procedure Laterality Date   CARPAL TUNNEL RELEASE Left 05/27/2017   Procedure: LEFT CARPAL TUNNEL RELEASE;  Surgeon: Charlotte Crumb, MD;  Location: [facility];  Service: Orthopedics;  Laterality: Left;   NO PAST SURGERIES      OPEN REDUCTION INTERNAL FIXATION (ORIF) DISTAL RADIAL FRACTURE Left 05/27/2017   Procedure: OPEN REDUCTION INTERNAL FIXATION (ORIF) LEFT DISTAL RADIAL FRACTURE;  Surgeon: Charlotte Crumb, MD;  Location: [facility];  Service: Orthopedics;  Laterality: Left;    Current Outpatient Medications on File Prior to Visit  Medication Sig Dispense Refill   allopurinol (ZYLOPRIM) 100 MG tablet Take 100 mg by mouth daily.     aspirin EC 81 MG tablet Take 81 mg by mouth daily.     atorvastatin (LIPITOR) 20 MG tablet Take by mouth.     atorvastatin (LIPITOR) 40 MG tablet Take 1 tablet (40 mg total) by mouth daily. 90 tablet 3   ciprofloxacin (CIPRO) 500 MG tablet Take 1 tablet (500 mg total) by mouth 2 (two) times daily. One po bid x 7 days 14 tablet 0   clindamycin (CLEOCIN) 300 MG capsule Take 1 capsule (300 mg total) by mouth 3 (three) times daily. 21 capsule 0   clindamycin (CLEOCIN) 300 MG capsule Take 1 capsule (300 mg total) by mouth 3 (three) times daily. 21 capsule 0   doxycycline (VIBRA-TABS) 100 MG tablet Take 1 tablet (100 mg total) by mouth 2 (two) times daily. 20 tablet 0   erythromycin ophthalmic ointment Place a 1/2 inch ribbon of ointment into the right lower eyelid.  Use 4 times a day for 5 days. 1 g 0   HYDROcodone-acetaminophen (NORCO/VICODIN) 5-325 MG tablet Take one tab po q 4 hrs prn pain 8 tablet 0  HYDROcodone-acetaminophen (NORCO/VICODIN) 5-325 MG tablet Take 1 tablet by mouth every 6 (six) hours as needed for moderate pain. 14 tablet 0   ibuprofen (ADVIL) 800 MG tablet Take 800 mg by mouth every 8 (eight) hours as needed.     lisinopril (PRINIVIL,ZESTRIL) 10 MG tablet Take 1 tablet (10 mg total) by mouth daily. 30 tablet 1   metoprolol (LOPRESSOR) 50 MG tablet Take 1 tablet (50 mg total) by mouth 2 (two) times daily. 180 tablet 2   metoprolol tartrate (LOPRESSOR) 50 MG tablet Take by mouth.     sildenafil (VIAGRA) 100 MG tablet Take 0.5-1 tablets (50-100 mg total) by  mouth daily as needed for erectile dysfunction. 10 tablet 1   sildenafil (VIAGRA) 100 MG tablet Take by mouth.     traMADol (ULTRAM) 50 MG tablet Take 1 tablet (50 mg total) by mouth every 6 (six) hours as needed. 15 tablet 0   No current facility-administered medications on file prior to visit.    Social History   Socioeconomic History   Marital status: Single    Spouse name: Not on file   Number of children: Not on file   Years of education: Not on file   Highest education level: Not on file  Occupational History   Not on file  Tobacco Use   Smoking status: Some Days    Packs/day: 0.25    Years: 39.00    Total pack years: 9.75    Types: Cigarettes    Start date: 01/04/1974   Smokeless tobacco: Never  Vaping Use   Vaping Use: Never used  Substance and Sexual Activity   Alcohol use: Yes    Alcohol/week: 0.0 standard drinks of alcohol    Comment: weekly   Drug use: No   Sexual activity: Not on file  Other Topics Concern   Not on file  Social History Narrative   Not on file   Social Determinants of Health   Financial Resource Strain: Not on file  Food Insecurity: Not on file  Transportation Needs: Not on file  Physical Activity: Not on file  Stress: Not on file  Social Connections: Not on file  Intimate Partner Violence: Not on file    Family History  Problem Relation Age of Onset   Cancer Father    Diabetes Sister    Diabetes Brother     BP (!) 176/100   Pulse 63   Ht '5\' 9"'$  (1.753 m)   Wt 224 lb (101.6 kg)   BMI 33.08 kg/m   Body mass index is 33.08 kg/m.      Objective:   Physical Exam Vitals and nursing note reviewed. Exam conducted with a chaperone present.  Constitutional:      Appearance: He is well-developed.  HENT:     Head: Normocephalic and atraumatic.  Eyes:     Conjunctiva/sclera: Conjunctivae normal.     Pupils: Pupils are equal, round, and reactive to light.  Cardiovascular:     Rate and Rhythm: Normal rate and regular rhythm.   Pulmonary:     Effort: Pulmonary effort is normal.  Abdominal:     Palpations: Abdomen is soft.  Musculoskeletal:       Arms:     Cervical back: Normal range of motion and neck supple.  Skin:    General: Skin is warm and dry.  Neurological:     Mental Status: He is alert and oriented to person, place, and time.     Cranial Nerves: No cranial nerve  deficit.     Motor: No abnormal muscle tone.     Coordination: Coordination normal.     Deep Tendon Reflexes: Reflexes are normal and symmetric. Reflexes normal.  Psychiatric:        Behavior: Behavior normal.        Thought Content: Thought content normal.        Judgment: Judgment normal.           Assessment & Plan:   Encounter Diagnoses  Name Primary?   Chronic pain of right elbow Yes   Acute idiopathic gout of right elbow    I feel he has acute gout.  I think the changes on the X-rays are old.  But I will get the CT of the elbow as recommended.  I will call in prednisone dose pack.  He has gout in the past and it runs in the family.  Return in one week. Get serum uric acid levels.  Call if any problem.  Precautions discussed.  Electronically Signed Sanjuana Kava, MD 11/2/20239:52 AM

## 2022-07-12 NOTE — Patient Instructions (Signed)
Address: [address] [place], [place] [address] Phone: [phone]  Call and schedule your appointment. We will work on the authorization.   Get your lab drawn

## 2022-07-13 LAB — URIC ACID: Uric Acid, Serum: 7.5 mg/dL (ref 4.0–8.0)

## 2022-07-19 ENCOUNTER — Encounter: Payer: Self-pay | Admitting: *Deleted

## 2022-07-19 ENCOUNTER — Encounter: Payer: Self-pay | Admitting: Orthopaedic Surgery

## 2022-07-19 ENCOUNTER — Ambulatory Visit (INDEPENDENT_AMBULATORY_CARE_PROVIDER_SITE_OTHER): Payer: Medicare Other | Admitting: Orthopaedic Surgery

## 2022-07-19 VITALS — Ht 69.0 in | Wt 224.0 lb

## 2022-07-19 DIAGNOSIS — M10021 Idiopathic gout, right elbow: Secondary | ICD-10-CM

## 2022-07-19 MED ORDER — ALLOPURINOL 300 MG PO TABS: 300.0000 mg | ORAL_TABLET | Freq: Every day | ORAL | 5 refills | Status: AC

## 2022-07-19 NOTE — Patient Instructions (Signed)
TAKE ALLOPURINOL '300MG'$  AS DIRECTED EVERY SINGLE DAY FOR THE REST OF YOUR LIFE. DO NOT MISS DOSES. THIS MEDICINE IS FOR YOUR GOUT.   As the weather changes and gets cooler, you may notice you are affected more. You may have more pain in your joints. This is normal. Dress warmly and make sure that area is covered well.   Dr.Keeling is here all day on Tuesdays, Wednesday mornings, and Thursday mornings. If you need anything such as a medication refill, please either call BEFORE the end of the day on [facility] or send a message through [place]. Your pharmacy can send a refill request for you. Calling by the end of the day on [facility] allows [place] time to send Dr.Keeling the request and for him to respond before he leaves on Thursdays.  If Dr. Luna Glasgow is out of the office, we may send it to one of the other providers and they may not refill it for the same amount that your original prescription is for.

## 2022-07-19 NOTE — Progress Notes (Signed)
My elbow is better.  He has much less pain of the right elbow.  His uric acid was 7.5.  He is on allopurinol 100.  I will increase to 300.  He is to take it for the rest of his life daily.  Right elbow has slight swelling but full ROM and no pain.  NV intact.  Encounter Diagnosis  Name Primary?   Acute idiopathic gout of right elbow Yes   Return prn.  Call if any problem.  Precautions discussed.  Electronically Signed Sanjuana Kava, MD 11/9/202310:49 AM

## 2022-07-25 ENCOUNTER — Ambulatory Visit (HOSPITAL_COMMUNITY): Admission: RE | Admit: 2022-07-25 | Payer: Medicare Other | Source: Ambulatory Visit

## 2022-07-25 ENCOUNTER — Encounter (HOSPITAL_COMMUNITY): Payer: Self-pay

## 2022-08-13 ENCOUNTER — Other Ambulatory Visit (HOSPITAL_COMMUNITY): Payer: Medicaid Other

## 2022-08-24 ENCOUNTER — Ambulatory Visit: Payer: Medicaid Other | Admitting: Gastroenterology

## 2022-09-07 ENCOUNTER — Emergency Department (HOSPITAL_COMMUNITY)
Admission: EM | Admit: 2022-09-07 | Discharge: 2022-09-07 | Discharge status: Against Medical Advice | Payer: Medicare Other

## 2022-09-08 ENCOUNTER — Emergency Department (HOSPITAL_COMMUNITY): Payer: Medicare Other

## 2022-09-08 ENCOUNTER — Emergency Department (HOSPITAL_COMMUNITY)
Admission: EM | Admit: 2022-09-08 | Discharge: 2022-09-08 | Disposition: A | Discharge status: Home / Self Care | Payer: Medicare Other | Attending: Emergency Medicine | Admitting: Emergency Medicine

## 2022-09-08 ENCOUNTER — Other Ambulatory Visit: Payer: Self-pay

## 2022-09-08 DIAGNOSIS — I10 Essential (primary) hypertension: Secondary | ICD-10-CM | POA: Diagnosis not present

## 2022-09-08 DIAGNOSIS — F17210 Nicotine dependence, cigarettes, uncomplicated: Secondary | ICD-10-CM | POA: Insufficient documentation

## 2022-09-08 DIAGNOSIS — Z7982 Long term (current) use of aspirin: Secondary | ICD-10-CM | POA: Diagnosis not present

## 2022-09-08 DIAGNOSIS — K529 Noninfective gastroenteritis and colitis, unspecified: Secondary | ICD-10-CM

## 2022-09-08 DIAGNOSIS — Z79899 Other long term (current) drug therapy: Secondary | ICD-10-CM | POA: Diagnosis not present

## 2022-09-08 DIAGNOSIS — R109 Unspecified abdominal pain: Secondary | ICD-10-CM | POA: Diagnosis not present

## 2022-09-08 DIAGNOSIS — I700 Atherosclerosis of aorta: Secondary | ICD-10-CM | POA: Diagnosis not present

## 2022-09-08 LAB — COMPREHENSIVE METABOLIC PANEL
ALT: 17 U/L (ref 0–44)
AST: 17 U/L (ref 15–41)
Albumin: 3.5 g/dL (ref 3.5–5.0)
Alkaline Phosphatase: 73 U/L (ref 38–126)
Anion gap: 7 (ref 5–15)
BUN: 10 mg/dL (ref 8–23)
CO2: 27 mmol/L (ref 22–32)
Calcium: 9.8 mg/dL (ref 8.9–10.3)
Chloride: 103 mmol/L (ref 98–111)
Creatinine, Ser: 1.03 mg/dL (ref 0.61–1.24)
GFR, Estimated: >60 mL/min (ref >60)
Glucose, Bld: 102 mg/dL — ABNORMAL HIGH (ref 70–99)
Potassium: 4.6 mmol/L (ref 3.5–5.1)
Sodium: 137 mmol/L (ref 135–145)
Total Bilirubin: 0.5 mg/dL (ref 0.3–1.2)
Total Protein: 7.8 g/dL (ref 6.5–8.1)

## 2022-09-08 LAB — CBC
HCT: 43.3 % (ref 39.0–52.0)
Hemoglobin: 14.4 g/dL (ref 13.0–17.0)
MCH: 31.8 pg (ref 26.0–34.0)
MCHC: 33.3 g/dL (ref 30.0–36.0)
MCV: 95.6 fL (ref 80.0–100.0)
Platelets: 236 10*3/uL (ref 150–400)
RBC: 4.53 MIL/uL (ref 4.22–5.81)
RDW: 15.4 % (ref 11.5–15.5)
WBC: 9.2 10*3/uL (ref 4.0–10.5)
nRBC: 0 % (ref 0.0–0.2)

## 2022-09-08 LAB — LACTIC ACID, PLASMA: Lactic Acid, Venous: 1.1 mmol/L (ref 0.5–1.9)

## 2022-09-08 LAB — LIPASE, BLOOD: Lipase: 31 U/L (ref 11–51)

## 2022-09-08 MED ORDER — CIPROFLOXACIN HCL 250 MG PO TABS
500.0000 mg | ORAL_TABLET | Freq: Once | ORAL | Status: AC
  Administered 2022-09-08: 500 mg via ORAL
  Filled 2022-09-08: qty 2

## 2022-09-08 MED ORDER — METRONIDAZOLE 500 MG PO TABS
500.0000 mg | ORAL_TABLET | Freq: Once | ORAL | Status: AC
  Administered 2022-09-08: 500 mg via ORAL
  Filled 2022-09-08: qty 1

## 2022-09-08 MED ORDER — IOHEXOL 300 MG/ML  SOLN
100.0000 mL | Freq: Once | INTRAMUSCULAR | Status: AC | PRN
  Administered 2022-09-08: 100 mL via INTRAVENOUS

## 2022-09-08 MED ORDER — CIPROFLOXACIN HCL 500 MG PO TABS: 500.0000 mg | ORAL_TABLET | Freq: Two times a day (BID) | ORAL | 0 refills | Status: DC

## 2022-09-08 MED ORDER — METRONIDAZOLE 500 MG PO TABS: 500.0000 mg | ORAL_TABLET | Freq: Two times a day (BID) | ORAL | 0 refills | Status: DC

## 2022-09-08 NOTE — Discharge Instructions (Addendum)
Evaluation of your abdominal pain revealed that you do have colitis which is inflammation with possible infection of the large intestines.  Started you on Cipro and Flagyl which are antibiotics that will cover for infection related to your colitis.  That you follow-up with GI for further evaluation.  I provided their contact information in your discharge instructions.  Also recommend that you follow-up with your PCP.

## 2022-09-08 NOTE — ED Notes (Signed)
Patient transported to CT 

## 2022-09-08 NOTE — ED Provider Notes (Cosign Needed)
[facility] EMERGENCY DEPARTMENT Provider Note   CSN: [id number] Arrival date & time: 09/08/22  9390     History  Chief Complaint  Patient presents with   Abdominal Pain   Diarrhea   HPI Mark Walter is a 66 y.o. male with hyperlipidemia, hypertension tobacco abuse presenting for abdominal pain. Started on Christmas Day.  Pain is intermittent and feels like cramping.  States he ate some deer meat and is concerned that this is causing his symptoms.  Denies diarrhea and vomiting and nausea.  Bowel movements have been regular.  Last bowel movement was this morning.  Denies Urinary changes.  Patient is a cigarette smoker.    Abdominal Pain Associated symptoms: diarrhea   Diarrhea Associated symptoms: abdominal pain        Home Medications Prior to Admission medications   Medication Sig Start Date End Date Taking? Authorizing Provider  ciprofloxacin (CIPRO) 500 MG tablet Take 1 tablet (500 mg total) by mouth 2 (two) times daily. 09/08/22  Yes Harriet Pho, PA-C  metroNIDAZOLE (FLAGYL) 500 MG tablet Take 1 tablet (500 mg total) by mouth 2 (two) times daily. 09/08/22  Yes Harriet Pho, PA-C  allopurinol (ZYLOPRIM) 300 MG tablet Take 1 tablet (300 mg total) by mouth daily. 07/19/22   Sanjuana Kava, MD  aspirin EC 81 MG tablet Take 81 mg by mouth daily.    [provider]  atorvastatin (LIPITOR) 20 MG tablet Take by mouth. 05/20/17   [provider]  atorvastatin (LIPITOR) 40 MG tablet Take 1 tablet (40 mg total) by mouth daily. 10/10/17   Soyla Dryer, PA-C  clindamycin (CLEOCIN) 300 MG capsule Take 1 capsule (300 mg total) by mouth 3 (three) times daily. 08/08/19   Triplett, Tammy, PA-C  clindamycin (CLEOCIN) 300 MG capsule Take 1 capsule (300 mg total) by mouth 3 (three) times daily. 04/27/20   Fredia Sorrow, MD  doxycycline (VIBRA-TABS) 100 MG tablet Take 1 tablet (100 mg total) by mouth 2 (two) times daily. 09/05/20   Felipa Furnace, DPM   erythromycin ophthalmic ointment Place a 1/2 inch ribbon of ointment into the right lower eyelid.  Use 4 times a day for 5 days. 06/07/19   Joy, Shawn C, PA-C  HYDROcodone-acetaminophen (NORCO/VICODIN) 5-325 MG tablet Take one tab po q 4 hrs prn pain 08/08/19   Triplett, Tammy, PA-C  HYDROcodone-acetaminophen (NORCO/VICODIN) 5-325 MG tablet Take 1 tablet by mouth every 6 (six) hours as needed for moderate pain. 10/22/21   Fredia Sorrow, MD  ibuprofen (ADVIL) 800 MG tablet Take 800 mg by mouth every 8 (eight) hours as needed. 07/02/22   [provider]  lisinopril (PRINIVIL,ZESTRIL) 10 MG tablet Take 1 tablet (10 mg total) by mouth daily. 02/05/18   Soyla Dryer, PA-C  metoprolol (LOPRESSOR) 50 MG tablet Take 1 tablet (50 mg total) by mouth 2 (two) times daily. 08/08/16   Soyla Dryer, PA-C  metoprolol tartrate (LOPRESSOR) 50 MG tablet Take by mouth. 08/08/16   [provider]  predniSONE (STERAPRED UNI-PAK 21 TAB) 5 MG (21) TBPK tablet Take 6 pills first day; 5 pills second day; 4 pills third day; 3 pills fourth day; 2 pills next day and 1 pill last day. 07/12/22   Sanjuana Kava, MD  sildenafil (VIAGRA) 100 MG tablet Take 0.5-1 tablets (50-100 mg total) by mouth daily as needed for erectile dysfunction. 05/20/17   Soyla Dryer, PA-C  sildenafil (VIAGRA) 100 MG tablet Take by mouth. 05/20/17   [provider]  traMADol (ULTRAM) 50 MG tablet Take 1 tablet (50 mg total) by mouth every 6 (six) hours as needed. 04/27/20   Fredia Sorrow, MD      Allergies    Pollen extract    Review of Systems   Review of Systems  Gastrointestinal:  Positive for abdominal pain and diarrhea.     Physical Exam   Vitals:   09/08/22 0850 09/08/22 1245  BP:  119/74  Pulse:  64  Resp: 16 18  Temp:  98.1 F (36.7 C)  SpO2:  97%    CONSTITUTIONAL:  well-appearing, NAD NEURO:  Alert and oriented x 3, CN 3-12 grossly intact EYES:  eyes equal and reactive ENT/NECK:  Supple,  no stridor  CARDIO:  regular rate and rhythm, appears well-perfused  PULM:  No respiratory distress, CTAB GI/GU:  non-distended, soft, non tender MSK/SPINE:  No gross deformities, no edema, moves all extremities  SKIN:  no rash, atraumatic   *Additional and/or pertinent findings included in MDM below   ED Results / Procedures / Treatments   Labs (all labs ordered are listed, but only abnormal results are displayed) Labs Reviewed  COMPREHENSIVE METABOLIC PANEL - Abnormal; Notable for the following components:      Result Value   Glucose, Bld 102 (*)    All other components within normal limits  LIPASE, BLOOD  CBC  LACTIC ACID, PLASMA  URINALYSIS, ROUTINE W REFLEX MICROSCOPIC    EKG None  Radiology CT Abdomen Pelvis W Contrast  Result Date: 09/08/2022 CLINICAL DATA:  66 year old male with acute abdominal and pelvic pain and diarrhea. EXAM: CT ABDOMEN AND PELVIS WITH CONTRAST TECHNIQUE: Multidetector CT imaging of the abdomen and pelvis was performed using the standard protocol following bolus administration of intravenous contrast. RADIATION DOSE REDUCTION: This exam was performed according to the departmental dose-optimization program which includes automated exposure control, adjustment of the mA and/or kV according to patient size and/or use of iterative reconstruction technique. CONTRAST:  144m OMNIPAQUE IOHEXOL 300 MG/ML  SOLN COMPARISON:  None Available. FINDINGS: Lower chest: No acute abnormality. Hepatobiliary: The liver and gallbladder are unremarkable. There is no evidence of intrahepatic or extrahepatic biliary dilatation. Pancreas: Unremarkable Spleen: Unremarkable Adrenals/Urinary Tract: The kidneys, adrenal glands and bladder are unremarkable. Stomach/Bowel: There is apparent wall thickening of the descending colon with mild adjacent inflammation likely representing a colitis. There is no evidence of bowel obstruction, pneumoperitoneum or other bowel wall thickening.  Colonic diverticulosis of all vein the descending and sigmoid colon noted. The appendix is normal. Vascular/Lymphatic: Abdominal aortic atherosclerotic calcifications noted without aneurysm. The mesenteric vasculature appear patent. No abnormal appearing lymph nodes are identified. Reproductive: Prostate is unremarkable. Other: No ascites, focal collection or pneumoperitoneum. Musculoskeletal: No acute or suspicious bony abnormalities are noted. Degenerative changes in the lumbar spine and hips identified. IMPRESSION: 1. Descending colonic wall thickening with mild adjacent inflammation likely representing colitis. No evidence of bowel obstruction, pneumoperitoneum or focal collection. Mesenteric vasculature appear patent. Clinical follow-up recommended. 2.  Aortic Atherosclerosis (ICD10-I70.0). Electronically Signed   By: JMargarette CanadaM.D.   On: 09/08/2022 12:30    Procedures Ultrasound ED Peripheral IV (Provider)  Date/Time: 09/08/2022 11:42 AM  Performed by: RHarriet Pho PA-C Authorized by: RHarriet Pho PA-C   Procedure details:    Indications: poor IV access     Skin Prep: isopropyl alcohol     Location:  Right AC   Angiocath:  20 G   Bedside Ultrasound Guided: Yes     Patient  tolerated procedure without complications: Yes     Dressing applied: Yes       Medications Ordered in ED Medications  iohexol (OMNIPAQUE) 300 MG/ML solution 100 mL (100 mLs Intravenous Contrast Given 09/08/22 1213)  ciprofloxacin (CIPRO) tablet 500 mg (500 mg Oral Given 09/08/22 1334)  metroNIDAZOLE (FLAGYL) tablet 500 mg (500 mg Oral Given 09/08/22 1334)    ED Course/ Medical Decision Making/ A&P                           Medical Decision Making Amount and/or Complexity of Data Reviewed Labs: ordered. Radiology: ordered.  Risk Prescription drug management.   Initial Impression and Ddx 66 year old male who is well-appearing he medically stable presenting for abdominal pain.  Physical exam  was unremarkable and revealed a soft and nontender abdomen.  Differential diagnosis for this complaint includes mesenteric ischemia, pancreatitis, cholecystitis, appendicitis, and colitis. Patient PMH that increases complexity of ED encounter: Tobacco abuse, hypertension, hyperlipidemia  Interpretation of Diagnostics I independent reviewed and interpreted the labs as followed: Hyperglycemia  - I independently visualized the following imaging with scope of interpretation limited to determining acute life threatening conditions related to emergency care: CT abdomen and pelvis, which revealed concerns for colitis in the descending colon  Patient Reassessment and Ultimate Disposition/Management Treated patient with Cipro and Flagyl for colitis.  Considered mesenteric ischemia but unlikely given normal lactic and abdominal pain was mild in nature.  Doubt pancreatitis cholecystitis and appendicitis given CT findings.  Advised patient to follow-up with GI and PCP for ongoing colitis.  Sent Flagyl and Cipro to his pharmacy.   Patient management required discussion with the following services or consulting groups:  None  Complexity of Problems Addressed Acute complicated illness or Injury  Additional Data Reviewed and Analyzed Further history obtained from: Past medical history and medications listed in the EMR  Patient Encounter Risk Assessment Prescriptions         Final Clinical Impression(s) / ED Diagnoses Final diagnoses:  Colitis    Rx / DC Orders ED Discharge Orders          Ordered    ciprofloxacin (CIPRO) 500 MG tablet  2 times daily        09/08/22 1336    metroNIDAZOLE (FLAGYL) 500 MG tablet  2 times daily        09/08/22 1336              Harriet Pho, [place] 09/08/22 1339

## 2022-09-08 NOTE — ED Triage Notes (Signed)
Pt c/o abd pain since 12/25 with some diarrhea. OTC meds not helpful. No n/v and afebrile.

## 2022-09-08 NOTE — ED Notes (Signed)
PA at bedside to attempt ultrasound IV insertion at this time.

## 2022-09-08 NOTE — ED Provider Triage Note (Cosign Needed)
Emergency Medicine Provider Triage Evaluation Note  Mark Walter , a 66 y.o. male  was evaluated in triage.  Pt complains of abdominal pain.  Started on Christmas Day.  Pain is intermittent and feels like cramping.  States he ate some deer meat and is concerned that this is causing his symptoms.  Denies diarrhea and vomiting and nausea.  Bowel movements have been regular.  Last bowel movement was this morning.  Denies Urinary changes.  Patient is a cigarette smoker.   Review of Systems  Positive: See above Negative: See above  Physical Exam  BP 119/79   Pulse 76   Temp 98.2 F (36.8 C) (Oral)   Resp 16   Ht '5\' 8"'$  (1.727 m)   Wt 102.1 kg   SpO2 95%   BMI 34.21 kg/m  Gen:   Awake, no distress  Resp:  Normal effort  MSK:   Moves extremities without difficulty  Other:    Medical Decision Making  Medically screening exam initiated at 9:21 AM.  Appropriate orders placed.  [name] was informed that the remainder of the evaluation will be completed by another provider, this initial triage assessment does not replace that evaluation, and the importance of remaining in the ED until their evaluation is complete.  Work up started   Harriet Pho, [place] 09/08/22 [phone]

## 2022-09-29 NOTE — Progress Notes (Addendum)
GI Office Note    Referring Provider: Carrolyn Meiers* Primary Care Physician:  Carrolyn Meiers, MD  Primary Gastroenterologist: Cristopher Estimable.Rourk, MD  Chief Complaint   Chief Complaint  Patient presents with   Colonoscopy    Colonoscopy screening    History of Present Illness   Mark Walter is a 67 y.o. male presenting today at the request of Fanta, Tesfaye Demissie* for positive cologaurd and recent ED visit.   ED visit 09/08/22. Presented with abdominal pain that began on christmas. Abdominal pain described as cramping. Reported regular bowel movements and denied N/V. CT A/P as outlined below. Labs normal except elevated glucose. Treated with cipro and flagyl for colitis.   CT A/P 09/08/22: -descending colonic wall thickening with mild adjacent inflammation (likely colitis) -sigmoid and descending colon diverticulosis  Today:  Having mild abd pain this morning but admits to eating some soy sauce peanuts yesterday. No pain otherwise. Denis fever, chills. Melena, brbpr, lack of appetite, unintentional weight loss, reflux, dysphagia, constipation, diarrhea, N/V, early satiety. Has BM after almost every meal. No straining.   Seldom use of NSAIDs. Smokes occasional. Weekend alcohol use   Current Outpatient Medications  Medication Sig Dispense Refill   allopurinol (ZYLOPRIM) 300 MG tablet Take 1 tablet (300 mg total) by mouth daily. 30 tablet 5   aspirin EC 81 MG tablet Take 81 mg by mouth daily.     ciprofloxacin (CIPRO) 500 MG/5ML (10%) suspension Take by mouth 2 (two) times daily. Do NOT administer via tube     ibuprofen (ADVIL) 800 MG tablet Take 800 mg by mouth every 8 (eight) hours as needed.     metroNIDAZOLE (FLAGYL) 500 MG tablet Take 500 mg by mouth 3 (three) times daily.     sildenafil (VIAGRA) 100 MG tablet Take 0.5-1 tablets (50-100 mg total) by mouth daily as needed for erectile dysfunction. 10 tablet 1   simvastatin (ZOCOR) 20 MG tablet Take 20  mg by mouth daily.     sucralfate (CARAFATE) 1 g tablet Take 1 g by mouth 4 (four) times daily.     No current facility-administered medications for this visit.    Past Medical History:  Diagnosis Date   Brain injury [facility]) age 68   states was unconscious for 3 days   Closed fracture of left distal radius    Hyperlipemia    Hypertension    Pre-diabetes    Tobacco abuse     Past Surgical History:  Procedure Laterality Date   CARPAL TUNNEL RELEASE Left 05/27/2017   Procedure: LEFT CARPAL TUNNEL RELEASE;  Surgeon: Charlotte Crumb, MD;  Location: [place];  Service: Orthopedics;  Laterality: Left;   NO PAST SURGERIES     OPEN REDUCTION INTERNAL FIXATION (ORIF) DISTAL RADIAL FRACTURE Left 05/27/2017   Procedure: OPEN REDUCTION INTERNAL FIXATION (ORIF) LEFT DISTAL RADIAL FRACTURE;  Surgeon: Charlotte Crumb, MD;  Location: [place];  Service: Orthopedics;  Laterality: Left;    Family History  Problem Relation Age of Onset   Cancer Father    Diabetes Sister    Diabetes Brother    Colon polyps Neg Hx    Colon cancer Neg Hx     Allergies as of 10/01/2022 - Review Complete 10/01/2022  Allergen Reaction Noted   Pollen extract Other (See Comments) 01/03/2016    Social History   Socioeconomic History   Marital status: Single    Spouse name: Not on file   Number of children: Not  on file   Years of education: Not on file   Highest education level: Not on file  Occupational History   Not on file  Tobacco Use   Smoking status: Some Days    Packs/day: 0.25    Years: 39.00    Total pack years: 9.75    Types: Cigarettes    Start date: 01/04/1974   Smokeless tobacco: Never  Vaping Use   Vaping Use: Never used  Substance and Sexual Activity   Alcohol use: Yes    Alcohol/week: 0.0 standard drinks of alcohol    Comment: weekly   Drug use: No   Sexual activity: Not on file  Other Topics Concern   Not on file  Social History Narrative   Not  on file   Social Determinants of Health   Financial Resource Strain: Not on file  Food Insecurity: Not on file  Transportation Needs: Not on file  Physical Activity: Not on file  Stress: Not on file  Social Connections: Not on file  Intimate Partner Violence: Not on file     Review of Systems   Gen: Denies any fever, chills, fatigue, weight loss, lack of appetite.  CV: Denies chest pain, heart palpitations, peripheral edema, syncope.  Resp: Denies shortness of breath at rest or with exertion. Denies wheezing or cough.  GI: see HPI GU : Denies urinary burning, urinary frequency, urinary hesitancy MS: Denies joint pain, muscle weakness, cramps, or limitation of movement.  Derm: Denies rash, itching, dry skin Psych: Denies depression, anxiety, memory loss, and confusion Heme: Denies bruising, bleeding, and enlarged lymph nodes.   Physical Exam   BP 129/77 (BP Location: Right Arm, Patient Position: Sitting, Cuff Size: Large)   Pulse 64   Temp (!) 96.9 F (36.1 C) (Temporal)   Ht '5\' 9"'$  (1.753 m)   Wt 233 lb (105.7 kg)   SpO2 96%   BMI 34.41 kg/m   General:   Alert and oriented. Pleasant and cooperative. Well-nourished and well-developed.  Head:  Normocephalic and atraumatic. Eyes:  Without icterus, sclera clear and conjunctiva pink.  Ears:  Normal auditory acuity. Mouth:  No deformity or lesions, oral mucosa pink.  Lungs:  Clear to auscultation bilaterally. No wheezes, rales, or rhonchi. No distress.  Heart:  S1, S2 present without murmurs appreciated.  Abdomen:  +BS, soft, non-distended. Very mild ttp to LLQ. No HSM noted. No guarding or rebound. No masses appreciated.  Rectal:  Deferred  Msk:  Symmetrical without gross deformities. Normal posture. Extremities:  Without edema. Neurologic:  Alert and  oriented x4;  grossly normal neurologically. Skin:  Intact without significant lesions or rashes. Psych:  Alert and cooperative. Normal mood and affect.   Assessment    Mark Walter is a 67 y.o. male with a history of HLD, HTN, tobacco abuse presenting today for ED follow up and positive Cologuard.  Recent Colitis: ED visit 09/08/2022 for abdominal pain.  CT with descending colonic wall thickening representing likely colitis as well as sigmoid and descending colon diverticulosis.  Was treated with Cipro and Flagyl.  Remained pain-free after starting antibiotics up until yesterday, patient feels as though it was flared up related to soy coated peanuts.  Very mild tenderness to LLQ on exam today.  Denies any melena, hematochezia, fever, chills, constipation, or diarrhea.  Advised patient to update med list for [place] and to make sure he finished antibiotics.  As he has history of positive Cologuard we will proceed with colonoscopy in the near future  given his recent colitis as well.  Positive Cologuard: Positive Cologuard in September 2022.  Denies any melena, hematochezia.  No alarm symptoms present.  Has had a recent episode of left-sided colitis as stated above.   PLAN   Proceed with colonoscopy with propofol by Dr. Gala Romney in near future: the risks, benefits, and alternatives have been discussed with the patient in detail. The patient states understanding and desires to proceed. ASA 2 Needs to provide updated med list prior to scheduling.  Stop taking carafate.    Venetia Night, MSN, FNP-BC, AGACNP-BC [facility]

## 2022-10-01 ENCOUNTER — Encounter: Payer: Self-pay | Admitting: Gastroenterology

## 2022-10-01 ENCOUNTER — Encounter: Payer: Self-pay | Admitting: *Deleted

## 2022-10-01 ENCOUNTER — Telehealth: Payer: Self-pay | Admitting: *Deleted

## 2022-10-01 ENCOUNTER — Ambulatory Visit (INDEPENDENT_AMBULATORY_CARE_PROVIDER_SITE_OTHER): Payer: 59 | Admitting: Gastroenterology

## 2022-10-01 VITALS — BP 129/77 | HR 64 | Temp 96.9°F | Ht 69.0 in | Wt 233.0 lb

## 2022-10-01 DIAGNOSIS — K5150 Left sided colitis without complications: Secondary | ICD-10-CM | POA: Diagnosis not present

## 2022-10-01 DIAGNOSIS — R195 Other fecal abnormalities: Secondary | ICD-10-CM | POA: Diagnosis not present

## 2022-10-01 MED ORDER — PEG 3350-KCL-NA BICARB-NACL 420 G PO SOLR: 4000.0000 mL | Freq: Once | ORAL | 0 refills | Status: AC

## 2022-10-01 NOTE — Telephone Encounter (Signed)
Pt has been scheduled for 10/22/22 with Dr.Rourk. Instructions mailed.

## 2022-10-01 NOTE — Telephone Encounter (Signed)
  Proceed with colonoscopy with propofol by Dr. Gala Romney in near future: the risks, benefits, and alternatives have been discussed with the patient in detail. The patient states understanding and desires to proceed. ASA 2

## 2022-10-01 NOTE — Telephone Encounter (Signed)
[employer] PA: APPROVED  Authorization #: [id number] DOS: 10/22/22-01/20/23

## 2022-10-01 NOTE — Patient Instructions (Signed)
Since you are not having any burning in your throat, upper abdomen, or having angio looks you may stop taking Carafate.  The antibiotics that you should have finished are ciprofloxacin and metronidazole.  Please provide [place] an updated med list so we can give appropriate instructions if there are any medications that need to be held prior to your colonoscopy.  Our scheduler reach out to you in regards to scheduling your procedure once we have your med list.  It was a pleasure to see you today. I want to create trusting relationships with patients. If you receive a survey regarding your visit,  I greatly appreciate you taking time to fill this out on paper or through your MyChart. I value your feedback.  Venetia Night, MSN, FNP-BC, AGACNP-BC [facility]

## 2022-10-01 NOTE — Telephone Encounter (Signed)
Pt brought medications in to have meds list updated.

## 2022-10-01 NOTE — Telephone Encounter (Signed)
Noted  

## 2022-10-22 ENCOUNTER — Encounter: Payer: Self-pay | Admitting: *Deleted

## 2022-10-22 ENCOUNTER — Telehealth (INDEPENDENT_AMBULATORY_CARE_PROVIDER_SITE_OTHER): Payer: Self-pay | Admitting: *Deleted

## 2022-10-22 ENCOUNTER — Other Ambulatory Visit: Payer: Self-pay | Admitting: *Deleted

## 2022-10-22 MED ORDER — PEG 3350-KCL-NA BICARB-NACL 420 G PO SOLR: 4000.0000 mL | Freq: Once | ORAL | 0 refills | Status: AC

## 2022-10-22 NOTE — Telephone Encounter (Signed)
Pt had to reschedule due to losing his instructions for his prep. Pt has been rescheduled for 11/12/22.

## 2022-10-22 NOTE — Telephone Encounter (Signed)
Endo received call from pt and he was not going for his procedure today scheduled with Dr. Gala Romney.

## 2022-11-12 ENCOUNTER — Telehealth: Payer: Self-pay | Admitting: Internal Medicine

## 2022-11-12 ENCOUNTER — Telehealth: Payer: Self-pay | Admitting: *Deleted

## 2022-11-12 ENCOUNTER — Encounter: Payer: Self-pay | Admitting: *Deleted

## 2022-11-12 NOTE — Telephone Encounter (Signed)
8:27am pt just walked in and said he needed to reschedule his appt for  procedure.  He didn't give a reason and said you could reach him at either number listed for him.

## 2022-11-12 NOTE — Telephone Encounter (Signed)
Katha Hamming A2 hours ago (8:28 AM)   8:27am pt just walked in and said he needed to reschedule his appt for  procedure.  He didn't give a reason and said you could reach him at either number listed for him.        Pt has been rescheduled until 12/03/22 at 11:15 am. New instructions printed and pt will come by to pick up.

## 2022-11-12 NOTE — Telephone Encounter (Signed)
Pt has been rescheduled until 12/03/22. New instructions printed and pt will come by to pick up

## 2022-12-03 ENCOUNTER — Ambulatory Visit (HOSPITAL_COMMUNITY): Payer: 59 | Admitting: Anesthesiology

## 2022-12-03 ENCOUNTER — Other Ambulatory Visit: Payer: Self-pay

## 2022-12-03 ENCOUNTER — Encounter (HOSPITAL_COMMUNITY)
Admission: RE | Disposition: A | Discharge status: Home / Self Care | Payer: Self-pay | Source: Home / Self Care | Attending: Internal Medicine

## 2022-12-03 ENCOUNTER — Encounter (HOSPITAL_COMMUNITY): Payer: Self-pay | Admitting: Internal Medicine

## 2022-12-03 ENCOUNTER — Ambulatory Visit (HOSPITAL_COMMUNITY)
Admission: RE | Admit: 2022-12-03 | Discharge: 2022-12-03 | Disposition: A | Discharge status: Home / Self Care | Payer: 59 | Attending: Internal Medicine | Admitting: Internal Medicine

## 2022-12-03 ENCOUNTER — Ambulatory Visit (HOSPITAL_BASED_OUTPATIENT_CLINIC_OR_DEPARTMENT_OTHER): Payer: 59 | Admitting: Anesthesiology

## 2022-12-03 DIAGNOSIS — D124 Benign neoplasm of descending colon: Secondary | ICD-10-CM

## 2022-12-03 DIAGNOSIS — D122 Benign neoplasm of ascending colon: Secondary | ICD-10-CM | POA: Diagnosis not present

## 2022-12-03 DIAGNOSIS — M1990 Unspecified osteoarthritis, unspecified site: Secondary | ICD-10-CM | POA: Diagnosis not present

## 2022-12-03 DIAGNOSIS — F17210 Nicotine dependence, cigarettes, uncomplicated: Secondary | ICD-10-CM | POA: Diagnosis not present

## 2022-12-03 DIAGNOSIS — D123 Benign neoplasm of transverse colon: Secondary | ICD-10-CM | POA: Diagnosis not present

## 2022-12-03 DIAGNOSIS — K635 Polyp of colon: Secondary | ICD-10-CM | POA: Diagnosis not present

## 2022-12-03 DIAGNOSIS — K5730 Diverticulosis of large intestine without perforation or abscess without bleeding: Secondary | ICD-10-CM | POA: Diagnosis not present

## 2022-12-03 DIAGNOSIS — Z1211 Encounter for screening for malignant neoplasm of colon: Secondary | ICD-10-CM | POA: Insufficient documentation

## 2022-12-03 DIAGNOSIS — R7303 Prediabetes: Secondary | ICD-10-CM | POA: Diagnosis not present

## 2022-12-03 DIAGNOSIS — E785 Hyperlipidemia, unspecified: Secondary | ICD-10-CM | POA: Insufficient documentation

## 2022-12-03 DIAGNOSIS — D120 Benign neoplasm of cecum: Secondary | ICD-10-CM

## 2022-12-03 DIAGNOSIS — I10 Essential (primary) hypertension: Secondary | ICD-10-CM | POA: Diagnosis not present

## 2022-12-03 DIAGNOSIS — R195 Other fecal abnormalities: Secondary | ICD-10-CM | POA: Insufficient documentation

## 2022-12-03 DIAGNOSIS — Z8719 Personal history of other diseases of the digestive system: Secondary | ICD-10-CM

## 2022-12-03 HISTORY — PX: COLONOSCOPY WITH PROPOFOL: SHX5780

## 2022-12-03 HISTORY — PX: POLYPECTOMY: SHX5525

## 2022-12-03 SURGERY — COLONOSCOPY WITH PROPOFOL
Anesthesia: General

## 2022-12-03 MED ORDER — LACTATED RINGERS IV SOLN
INTRAVENOUS | Status: DC
  Administered 2022-12-03: 1000 mL via INTRAVENOUS

## 2022-12-03 MED ORDER — PROPOFOL 10 MG/ML IV BOLUS
INTRAVENOUS | Status: DC | PRN
  Administered 2022-12-03 (×2): 50 mg via INTRAVENOUS
  Administered 2022-12-03: 130 mg via INTRAVENOUS
  Administered 2022-12-03 (×3): 50 mg via INTRAVENOUS
  Administered 2022-12-03: 30 mg via INTRAVENOUS

## 2022-12-03 MED ORDER — LIDOCAINE HCL (CARDIAC) PF 100 MG/5ML IV SOSY
PREFILLED_SYRINGE | INTRAVENOUS | Status: DC | PRN
  Administered 2022-12-03: 50 mg via INTRAVENOUS

## 2022-12-03 MED ORDER — STERILE WATER FOR IRRIGATION IR SOLN
Status: DC | PRN
  Administered 2022-12-03: 100 mL

## 2022-12-03 NOTE — Op Note (Signed)
[facility] Patient Name: Mark Walter Procedure Date: 12/03/2022 10:49 AM MRN: [id number] Date of Birth: 1956/01/23 Attending MD: Norvel Richards , MD, [id number] CSN: [id number] Age: 67 Admit Type: Outpatient Procedure:                Colonoscopy Indications:              Positive Cologuard test Providers:                Norvel Richards, MD, Charlsie Quest. Theda Sers RN, RN,                            Raphael Gibney, Technician Referring MD:              Medicines:                Propofol per Anesthesia Complications:            No immediate complications. Estimated Blood Loss:     Estimated blood loss was minimal. Procedure:                Pre-Anesthesia Assessment:                           - Prior to the procedure, a History and Physical                            was performed, and patient medications and                            allergies were reviewed. The patient's tolerance of                            previous anesthesia was also reviewed. The risks                            and benefits of the procedure and the sedation                            options and risks were discussed with the patient.                            All questions were answered, and informed consent                            was obtained. Prior Anticoagulants: The patient has                            taken no anticoagulant or antiplatelet agents. ASA                            Grade Assessment: III - A patient with severe                            systemic disease. After reviewing the risks and  benefits, the patient was deemed in satisfactory                            condition to undergo the procedure.                           After obtaining informed consent, the colonoscope                            was passed under direct vision. Throughout the                            procedure, the patient's blood pressure, pulse, and                             oxygen saturations were monitored continuously. The                            [id number]) scope was introduced through                            the anus and advanced to the the cecum, identified                            by appendiceal orifice and ileocecal valve. The                            colonoscopy was performed without difficulty. The                            patient tolerated the procedure well. The quality                            of the bowel preparation was adequate. The                            ileocecal valve, appendiceal orifice, and rectum                            were photographed. Scope In: 11:06:50 AM Scope Out: 11:32:12 AM Scope Withdrawal Time: 0 hours 21 minutes 9 seconds  Total Procedure Duration: 0 hours 25 minutes 22 seconds  Findings:      The perianal and digital rectal examinations were normal.      Scattered medium-mouthed diverticula were found in the entire colon.      Five semi-pedunculated polyps were found in the descending colon,       ascending colon and cecum. The polyps were 5 to 7 mm in size. These       polyps were removed with a saline injection-lift technique using a cold       snare. Resection and retrieval were complete. Estimated blood loss was       minimal.      Two pedunculated polyps were found in the ascending colon and mid       ascending colon. The polyps were 10 mm in size. These  polyps were       removed with a hot snare. Resection and retrieval were complete.       Estimated blood loss was minimal.      The exam was otherwise without abnormality on direct and retroflexion       views. Impression:               - Diverticulosis in the entire examined colon.                           - Five 5 to 7 mm polyps in the descending colon, in                            the ascending colon and in the cecum, removed using                            injection-lift and a cold snare. Resected and                             retrieved.                           - Two 10 mm polyps in the ascending colon and in                            the mid ascending colon, removed with a hot snare.                            Resected and retrieved.                           - The examination was otherwise normal on direct                            and retroflexion views. Moderate Sedation:      Moderate (conscious) sedation was personally administered by an       anesthesia professional. The following parameters were monitored: oxygen       saturation, heart rate, blood pressure, respiratory rate, EKG, adequacy       of pulmonary ventilation, and response to care. Recommendation:           - Patient has a contact number available for                            emergencies. The signs and symptoms of potential                            delayed complications were discussed with the                            patient. Return to normal activities tomorrow.                            Written discharge instructions were provided to the  patient.                           - Advance diet as tolerated.                           - Continue present medications.                           - Repeat colonoscopy date to be determined after                            pending pathology results are reviewed for                            surveillance.                           - Return to GI office (date not yet determined). Procedure Code(s):        --- Professional ---                           [phone], Colonoscopy, flexible; with removal of                            tumor(s), polyp(s), or other lesion(s) by snare                            technique                           [id number], Colonoscopy, flexible; with directed                            submucosal injection(s), any substance Diagnosis Code(s):        --- Professional ---                           D12.4, Benign neoplasm of descending colon                            D12.0, Benign neoplasm of cecum                           D12.2, Benign neoplasm of ascending colon                           R19.5, Other fecal abnormalities                           K57.30, Diverticulosis of large intestine without                            perforation or abscess without bleeding CPT copyright 2022 American Medical Association. All rights reserved. The codes documented in this report are preliminary and upon coder review may  be revised to meet current compliance requirements. Cristopher Estimable. Gala Romney, MD Norvel Richards, MD  12/03/2022 11:41:21 AM This report has been signed electronically. Number of Addenda: 0

## 2022-12-03 NOTE — Anesthesia Postprocedure Evaluation (Signed)
Anesthesia Post Note  Patient: Mark Walter  Procedure(s) Performed: COLONOSCOPY WITH PROPOFOL POLYPECTOMY  Patient location during evaluation: Phase II Anesthesia Type: General Level of consciousness: awake and alert and oriented Pain management: pain level controlled Vital Signs Assessment: post-procedure vital signs reviewed and stable Respiratory status: spontaneous breathing, nonlabored ventilation and respiratory function stable Cardiovascular status: blood pressure returned to baseline and stable Postop Assessment: no apparent nausea or vomiting Anesthetic complications: no  No notable events documented.   Last Vitals:  Vitals:   12/03/22 1014 12/03/22 1137  BP: 131/81 110/87  Pulse: (!) 58 70  Resp: 17 17  Temp: 36.4 C 36.5 C  SpO2: 96% 98%    Last Pain:  Vitals:   12/03/22 1137  TempSrc: Oral  PainSc: 0-No pain                 Stokely Jeancharles C Edna Grover

## 2022-12-03 NOTE — Discharge Instructions (Addendum)
  Colonoscopy Discharge Instructions  Read the instructions outlined below and refer to this sheet in the next few weeks. These discharge instructions provide you with general information on caring for yourself after you leave the hospital. Your doctor may also give you specific instructions. While your treatment has been planned according to the most current medical practices available, unavoidable complications occasionally occur. If you have any problems or questions after discharge, call Dr. Gala Romney at [phone]. ACTIVITY You may resume your regular activity, but move at a slower pace for the next 24 hours.  Take frequent rest periods for the next 24 hours.  Walking will help get rid of the air and reduce the bloated feeling in your belly (abdomen).  No driving for 24 hours (because of the medicine (anesthesia) used during the test).   Do not sign any important legal documents or operate any machinery for 24 hours (because of the anesthesia used during the test).  NUTRITION Drink plenty of fluids.  You may resume your normal diet as instructed by your doctor.  Begin with a light meal and progress to your normal diet. Heavy or fried foods are harder to digest and may make you feel sick to your stomach (nauseated).  Avoid alcoholic beverages for 24 hours or as instructed.  MEDICATIONS You may resume your normal medications unless your doctor tells you otherwise.  WHAT YOU CAN EXPECT TODAY Some feelings of bloating in the abdomen.  Passage of more gas than usual.  Spotting of blood in your stool or on the toilet paper.  IF YOU HAD POLYPS REMOVED DURING THE COLONOSCOPY: No aspirin products for 7 days or as instructed.  No alcohol for 7 days or as instructed.  Eat a soft diet for the next 24 hours.  FINDING OUT THE RESULTS OF YOUR TEST Not all test results are available during your visit. If your test results are not back during the visit, make an appointment with your caregiver to find out the  results. Do not assume everything is normal if you have not heard from your caregiver or the medical facility. It is important for you to follow up on all of your test results.  SEEK IMMEDIATE MEDICAL ATTENTION IF: You have more than a spotting of blood in your stool.  Your belly is swollen (abdominal distention).  You are nauseated or vomiting.  You have a temperature over 101.  You have abdominal pain or discomfort that is severe or gets worse throughout the day.      7 polyps removed in your colon today  Colon polyp and diverticulosis information provided   further recommendations to follow pending review of pathology report   at patient request, I called Florestine Avers at [phone] -
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
## 2022-12-03 NOTE — Transfer of Care (Signed)
Immediate Anesthesia Transfer of Care Note  Patient: Mark Walter  Procedure(s) Performed: COLONOSCOPY WITH PROPOFOL POLYPECTOMY  Patient Location: Endoscopy Unit  Anesthesia Type:General  Level of Consciousness: awake and patient cooperative  Airway & Oxygen Therapy: Patient Spontanous Breathing  Post-op Assessment: Report given to RN and Post -op Vital signs reviewed and stable  Post vital signs: Reviewed and stable  Last Vitals:  Vitals Value Taken Time  BP 110/87 12/03/22 1137  Temp 36.5 C 12/03/22 1137  Pulse 70 12/03/22 1137  Resp 17 12/03/22 1137  SpO2 98 % 12/03/22 1137    Last Pain:  Vitals:   12/03/22 1137  TempSrc: Oral  PainSc: 0-No pain      Patients Stated Pain Goal: 10 ([id number])  Complications: No notable events documented.

## 2022-12-03 NOTE — Anesthesia Procedure Notes (Signed)
Date/Time: 12/03/2022 11:00 AM  Performed by: Vista Deck, CRNAPre-anesthesia Checklist: Patient identified, Emergency Drugs available, Suction available, Timeout performed and Patient being monitored Patient Re-evaluated:Patient Re-evaluated prior to induction Oxygen Delivery Method: Nasal Cannula
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
## 2022-12-04 ENCOUNTER — Other Ambulatory Visit: Payer: Self-pay | Admitting: Physician Assistant

## 2022-12-04 ENCOUNTER — Encounter: Payer: Self-pay | Admitting: Internal Medicine

## 2022-12-04 LAB — SURGICAL PATHOLOGY

## 2022-12-12 ENCOUNTER — Encounter (HOSPITAL_COMMUNITY): Payer: Self-pay | Admitting: Internal Medicine

## 2022-12-18 ENCOUNTER — Other Ambulatory Visit: Payer: Self-pay | Admitting: Physician Assistant

## 2023-01-01 ENCOUNTER — Emergency Department (HOSPITAL_COMMUNITY): Payer: 59

## 2023-01-01 ENCOUNTER — Emergency Department (HOSPITAL_COMMUNITY)
Admission: EM | Admit: 2023-01-01 | Discharge: 2023-01-01 | Disposition: A | Discharge status: Home / Self Care | Payer: 59 | Attending: Emergency Medicine | Admitting: Emergency Medicine

## 2023-01-01 ENCOUNTER — Encounter (HOSPITAL_COMMUNITY): Payer: Self-pay | Admitting: Emergency Medicine

## 2023-01-01 ENCOUNTER — Other Ambulatory Visit: Payer: Self-pay

## 2023-01-01 DIAGNOSIS — I10 Essential (primary) hypertension: Secondary | ICD-10-CM | POA: Insufficient documentation

## 2023-01-01 DIAGNOSIS — S99912A Unspecified injury of left ankle, initial encounter: Secondary | ICD-10-CM | POA: Diagnosis not present

## 2023-01-01 DIAGNOSIS — M25572 Pain in left ankle and joints of left foot: Secondary | ICD-10-CM | POA: Diagnosis present

## 2023-01-01 DIAGNOSIS — M7989 Other specified soft tissue disorders: Secondary | ICD-10-CM | POA: Diagnosis not present

## 2023-01-01 DIAGNOSIS — F17200 Nicotine dependence, unspecified, uncomplicated: Secondary | ICD-10-CM | POA: Diagnosis not present

## 2023-01-01 DIAGNOSIS — X501XXA Overexertion from prolonged static or awkward postures, initial encounter: Secondary | ICD-10-CM | POA: Diagnosis not present
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
## 2023-01-01 NOTE — ED Triage Notes (Signed)
Pt injured left ankle x 3 days ago. Swelling noted. Pt ambulatory with limp. W/c given. A/o. Nad.

## 2023-01-01 NOTE — Discharge Instructions (Signed)
You were seen in the emergency department for ankle pain.  As we discussed, your x-ray did not show any broken or dislocated bones. I think you likely sprained your ankle. We have wrapped this for you. Keep it wrapped at home as well. You can take ibuprofen and/or tylenol as needed for pain, use ice for swelling.  I've attached the contact information for the orthopedist for you to follow up with if your symptoms don't improve.   Continue to monitor how you're doing and return to the ER for new or worsening symptoms.

## 2023-01-04 ENCOUNTER — Telehealth: Payer: Self-pay

## 2023-01-04 NOTE — Telephone Encounter (Signed)
        Patient  visited [facility] on 01/01/2023  for injury of left ankle.   Telephone encounter attempt :  1st  A HIPAA compliant voice message was left requesting a return call.  Instructed patient to call back at [phone].   [facility]  [facility] Population Health Community Resource Care Guide   [name]  ?? [phone]   Website: [email or web address]  [email or web address]

## 2023-01-09 ENCOUNTER — Telehealth: Payer: Self-pay

## 2023-01-09 NOTE — Telephone Encounter (Signed)
Transition Care Management Follow-up Telephone Call Date of discharge and from where: 01/01/2023 [facility] How have you been since you were released from the hospital? Patient stated he is feeling better but is still a little sore. Any questions or concerns? No  Items Reviewed: Did the pt receive and understand the discharge instructions provided? Yes  Medications obtained and verified? Yes  Other? No  Any new allergies since your discharge? No  Dietary orders reviewed? Yes Do you have support at home?  Patients' wife has been caring for him.    Follow up appointments reviewed:  PCP Hospital f/u appt confirmed?  Patient stated he is feeling better but will c ontact his PCP if needed.  Scheduled to see  on  @ . Specialist Hospital f/u appt confirmed? No  Scheduled to see  on  @ . Are transportation arrangements needed? No  If their condition worsens, is the pt aware to call PCP or go to the Emergency Dept.? Yes Was the patient provided with contact information for the PCP's office or ED? Yes Was to pt encouraged to call back with questions or concerns? Yes  Hernandez [facility]  [facility] Population Health Community Resource Care Guide   [email or web address]  ?? [phone]   Website: [email or web address]  [email or web address]

## 2023-03-08 ENCOUNTER — Encounter (HOSPITAL_COMMUNITY): Payer: Self-pay | Admitting: Emergency Medicine

## 2023-03-08 ENCOUNTER — Emergency Department (HOSPITAL_COMMUNITY): Payer: 59

## 2023-03-08 ENCOUNTER — Other Ambulatory Visit: Payer: Self-pay

## 2023-03-08 ENCOUNTER — Emergency Department (HOSPITAL_COMMUNITY)
Admission: EM | Admit: 2023-03-08 | Discharge: 2023-03-08 | Disposition: A | Discharge status: Home / Self Care | Payer: 59 | Attending: Emergency Medicine | Admitting: Emergency Medicine

## 2023-03-08 DIAGNOSIS — Z23 Encounter for immunization: Secondary | ICD-10-CM | POA: Insufficient documentation

## 2023-03-08 DIAGNOSIS — M1712 Unilateral primary osteoarthritis, left knee: Secondary | ICD-10-CM | POA: Diagnosis not present

## 2023-03-08 DIAGNOSIS — M25532 Pain in left wrist: Secondary | ICD-10-CM | POA: Insufficient documentation

## 2023-03-08 DIAGNOSIS — S82092A Other fracture of left patella, initial encounter for closed fracture: Secondary | ICD-10-CM | POA: Diagnosis not present

## 2023-03-08 DIAGNOSIS — S022XXA Fracture of nasal bones, initial encounter for closed fracture: Secondary | ICD-10-CM | POA: Diagnosis not present

## 2023-03-08 DIAGNOSIS — M25562 Pain in left knee: Secondary | ICD-10-CM | POA: Diagnosis not present

## 2023-03-08 DIAGNOSIS — S0990XA Unspecified injury of head, initial encounter: Secondary | ICD-10-CM | POA: Diagnosis not present

## 2023-03-08 DIAGNOSIS — S0101XA Laceration without foreign body of scalp, initial encounter: Secondary | ICD-10-CM | POA: Insufficient documentation

## 2023-03-08 DIAGNOSIS — M19032 Primary osteoarthritis, left wrist: Secondary | ICD-10-CM | POA: Diagnosis not present

## 2023-03-08 DIAGNOSIS — M25462 Effusion, left knee: Secondary | ICD-10-CM | POA: Diagnosis not present

## 2023-03-08 DIAGNOSIS — S52502A Unspecified fracture of the lower end of left radius, initial encounter for closed fracture: Secondary | ICD-10-CM | POA: Diagnosis not present

## 2023-03-08 DIAGNOSIS — S61512A Laceration without foreign body of left wrist, initial encounter: Secondary | ICD-10-CM | POA: Diagnosis not present

## 2023-03-08 DIAGNOSIS — S81012A Laceration without foreign body, left knee, initial encounter: Secondary | ICD-10-CM | POA: Diagnosis not present

## 2023-03-08 MED ORDER — TETANUS-DIPHTH-ACELL PERTUSSIS 5-2.5-18.5 LF-MCG/0.5 IM SUSY
0.5000 mL | PREFILLED_SYRINGE | Freq: Once | INTRAMUSCULAR | Status: AC
  Administered 2023-03-08: 0.5 mL via INTRAMUSCULAR
  Filled 2023-03-08: qty 0.5

## 2023-03-08 MED ORDER — IBUPROFEN 600 MG PO TABS: 600.0000 mg | ORAL_TABLET | Freq: Three times a day (TID) | ORAL | 0 refills | Status: AC
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
## 2023-03-08 NOTE — Discharge Instructions (Signed)
This small laceration appears to be well-approximated and should heal well without any intervention, as discussed avoid washing this area for the next 4 to 5 days if you can manage.  Your x-rays and CT scans are negative for any acute injury.  Your tetanus shot has been updated today.
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
## 2023-03-12 ENCOUNTER — Ambulatory Visit (INDEPENDENT_AMBULATORY_CARE_PROVIDER_SITE_OTHER): Payer: 59 | Admitting: Orthopaedic Surgery

## 2023-03-12 ENCOUNTER — Telehealth: Payer: Self-pay | Admitting: Orthopaedic Surgery

## 2023-03-12 ENCOUNTER — Encounter: Payer: Self-pay | Admitting: Orthopaedic Surgery

## 2023-03-12 ENCOUNTER — Ambulatory Visit (HOSPITAL_COMMUNITY)
Admission: RE | Admit: 2023-03-12 | Discharge: 2023-03-12 | Disposition: A | Discharge status: Home / Self Care | Payer: 59 | Source: Ambulatory Visit | Attending: Orthopedic Surgery | Admitting: Orthopedic Surgery

## 2023-03-12 VITALS — BP 157/71 | HR 85 | Ht 69.0 in | Wt 220.0 lb

## 2023-03-12 DIAGNOSIS — S76112A Strain of left quadriceps muscle, fascia and tendon, initial encounter: Secondary | ICD-10-CM

## 2023-03-12 DIAGNOSIS — S8992XA Unspecified injury of left lower leg, initial encounter: Secondary | ICD-10-CM | POA: Diagnosis not present

## 2023-03-12 DIAGNOSIS — S81012A Laceration without foreign body, left knee, initial encounter: Secondary | ICD-10-CM | POA: Diagnosis not present

## 2023-03-12 DIAGNOSIS — S83242D Other tear of medial meniscus, current injury, left knee, subsequent encounter: Secondary | ICD-10-CM | POA: Insufficient documentation

## 2023-03-12 DIAGNOSIS — S83512A Sprain of anterior cruciate ligament of left knee, initial encounter: Secondary | ICD-10-CM | POA: Diagnosis not present

## 2023-03-12 MED ORDER — HYDROCODONE-ACETAMINOPHEN 5-325 MG PO TABS: 1.0000 | ORAL_TABLET | ORAL | 0 refills | Status: AC | PRN

## 2023-03-12 NOTE — Telephone Encounter (Signed)
Dr Hilda Lias is going to see patient back in the office tomorrow to aspirate the knee / he has a joint effusion   I called him per Dr Hilda Lias and advised him  Cancelled appointment with Dr Romeo Apple

## 2023-03-12 NOTE — Patient Instructions (Addendum)
Follow up with Dr Romeo Apple to review MRI scan   Scan is today at 1 at [facility], you should be there at 12:30
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
## 2023-03-13 ENCOUNTER — Encounter: Payer: Self-pay | Admitting: Orthopaedic Surgery

## 2023-03-13 ENCOUNTER — Ambulatory Visit (INDEPENDENT_AMBULATORY_CARE_PROVIDER_SITE_OTHER): Payer: 59 | Admitting: Orthopaedic Surgery

## 2023-03-13 DIAGNOSIS — G8929 Other chronic pain: Secondary | ICD-10-CM | POA: Diagnosis not present

## 2023-03-13 DIAGNOSIS — M25562 Pain in left knee: Secondary | ICD-10-CM | POA: Diagnosis not present

## 2023-03-13 MED ORDER — METHYLPREDNISOLONE ACETATE 40 MG/ML IJ SUSP
40.0000 mg | Freq: Once | INTRAMUSCULAR | Status: AC
  Administered 2023-03-13: 40 mg via INTRA_ARTICULAR

## 2023-03-13 NOTE — Progress Notes (Signed)
MRI from yesterday showed: IMPRESSION: 1. Mild to moderate distal quadriceps tendinosis. No quadriceps tendon tear is visualized. 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 2. 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 3. Chronic ACL insufficiency. 4. Degenerative changes and attenuation of the mediolateral menisci as above. 5. Large joint effusion. 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 6. 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 A follow-up surveillance MRI in 1 year may be considered to ensure against the development of more concerning features of a well differentiated chondrosarcoma.  He does not have quadriceps tear as I had suspected. I have told the patient of the findings of the MRI.  He does not want to consider any surgery.  He says he saw Dr. Romeo Apple in the past and that surgery was recommended then.  Left knee has large effusion and crepitus, ROM 5 to 100.  Anterior draw positive and is also medial McMurray.  NV intact.  Encounter Diagnosis  Name Primary?   Chronic pain of left knee Yes   PROCEDURE NOTE:  The patient request injection, verbal consent was obtained.  The left knee was prepped appropriately after time out was performed.   Sterile technique was observed and anesthesia was provided by ethyl chloride and a 20-gauge needle was used to inject the knee area.  A 16-gauge needle was then used to aspirate the knee.  Color of fluid aspirated was blood  tinged  Total cc's aspirated was 85.    Injection of 1 cc of DepoMedrol 40 with several cc's of plain xylocaine was then performed.  A band aid dressing was applied.  The patient was advised to apply ice later today and tomorrow to the injection sight as needed.  Use the crutches and knee immobilizer.  Return in one week.  Call if any problem.  Precautions discussed.  Electronically Signed Darreld Mclean, MD 7/3/202410:48 AM

## 2023-03-20 ENCOUNTER — Encounter: Payer: Self-pay | Admitting: Orthopaedic Surgery

## 2023-03-20 ENCOUNTER — Ambulatory Visit (INDEPENDENT_AMBULATORY_CARE_PROVIDER_SITE_OTHER): Payer: 59 | Admitting: Orthopaedic Surgery

## 2023-03-20 DIAGNOSIS — S0101XD Laceration without foreign body of scalp, subsequent encounter: Secondary | ICD-10-CM | POA: Diagnosis not present

## 2023-03-20 DIAGNOSIS — E785 Hyperlipidemia, unspecified: Secondary | ICD-10-CM | POA: Diagnosis not present

## 2023-03-20 DIAGNOSIS — G8929 Other chronic pain: Secondary | ICD-10-CM

## 2023-03-20 DIAGNOSIS — M109 Gout, unspecified: Secondary | ICD-10-CM | POA: Diagnosis not present

## 2023-03-20 DIAGNOSIS — M25562 Pain in left knee: Secondary | ICD-10-CM

## 2023-03-20 NOTE — Progress Notes (Signed)
My knee is better.  He has less pain and swelling of the left knee.  He has been using the knee immobilizer and one crutch. He has no new trauma.  He has effusion of the left knee but less.  He has crepitus.  ROM is 0 to 110.  He has laxity of knee and positive drawer and positive medial McMurray.  NV intact.  He has no distal edema.  Encounter Diagnosis  Name Primary?   Chronic pain of left knee Yes   He can stop the knee immobilizer.  Use crutch as needed.  Return in two weeks.  Call if any problem.  Precautions discussed.  Electronically Signed Darreld Mclean, MD 7/10/20249:19 AM

## 2023-03-22 DIAGNOSIS — R7303 Prediabetes: Secondary | ICD-10-CM | POA: Diagnosis not present

## 2023-03-22 DIAGNOSIS — H6010 Cellulitis of external ear, unspecified ear: Secondary | ICD-10-CM | POA: Diagnosis not present

## 2023-03-25 ENCOUNTER — Ambulatory Visit: Payer: 59 | Admitting: Orthopedic Surgery

## 2023-04-03 ENCOUNTER — Ambulatory Visit (INDEPENDENT_AMBULATORY_CARE_PROVIDER_SITE_OTHER): Payer: 59 | Admitting: Orthopaedic Surgery

## 2023-04-03 ENCOUNTER — Encounter: Payer: Self-pay | Admitting: Orthopaedic Surgery

## 2023-04-03 VITALS — BP 144/82 | HR 58 | Ht 69.0 in | Wt 220.0 lb

## 2023-04-03 DIAGNOSIS — G8929 Other chronic pain: Secondary | ICD-10-CM

## 2023-04-03 DIAGNOSIS — M25462 Effusion, left knee: Secondary | ICD-10-CM

## 2023-04-03 DIAGNOSIS — M25562 Pain in left knee: Secondary | ICD-10-CM

## 2023-04-03 NOTE — Progress Notes (Signed)
I am better.  He has slight effusion of the left knee but has less pain. ROM is improved and he has only slight limp today.  He is walking better.  He is sleeping better.  He has stopped the splint.  Encounter Diagnosis  Name Primary?   Chronic pain of left knee Yes   I will see him in three weeks.  If the knee gets worse, call.  If it gets better, call and cancel.  Call if any problem.  Precautions discussed.  Electronically Signed Darreld Mclean, MD 7/24/20248:50 AM

## 2023-04-16 DIAGNOSIS — R1084 Generalized abdominal pain: Secondary | ICD-10-CM | POA: Diagnosis not present

## 2023-04-16 DIAGNOSIS — E785 Hyperlipidemia, unspecified: Secondary | ICD-10-CM | POA: Diagnosis not present

## 2023-04-16 DIAGNOSIS — R141 Gas pain: Secondary | ICD-10-CM | POA: Diagnosis not present

## 2023-04-24 ENCOUNTER — Ambulatory Visit: Payer: 59 | Admitting: Orthopaedic Surgery

## 2023-04-30 DIAGNOSIS — Z0001 Encounter for general adult medical examination with abnormal findings: Secondary | ICD-10-CM | POA: Diagnosis not present

## 2023-04-30 DIAGNOSIS — M1A00X0 Idiopathic chronic gout, unspecified site, without tophus (tophi): Secondary | ICD-10-CM | POA: Diagnosis not present

## 2023-04-30 DIAGNOSIS — E785 Hyperlipidemia, unspecified: Secondary | ICD-10-CM | POA: Diagnosis not present

## 2023-04-30 DIAGNOSIS — F17210 Nicotine dependence, cigarettes, uncomplicated: Secondary | ICD-10-CM | POA: Diagnosis not present

## 2023-04-30 DIAGNOSIS — R7303 Prediabetes: Secondary | ICD-10-CM | POA: Diagnosis not present

## 2023-04-30 DIAGNOSIS — Z1389 Encounter for screening for other disorder: Secondary | ICD-10-CM | POA: Diagnosis not present

## 2023-05-07 ENCOUNTER — Ambulatory Visit: Payer: 59 | Admitting: Orthopaedic Surgery

## 2023-05-14 ENCOUNTER — Ambulatory Visit: Payer: 59 | Admitting: Orthopaedic Surgery

## 2023-12-10 DIAGNOSIS — Z1389 Encounter for screening for other disorder: Secondary | ICD-10-CM | POA: Diagnosis not present

## 2023-12-10 DIAGNOSIS — R7303 Prediabetes: Secondary | ICD-10-CM | POA: Diagnosis not present

## 2023-12-10 DIAGNOSIS — I10 Essential (primary) hypertension: Secondary | ICD-10-CM | POA: Diagnosis not present

## 2023-12-10 DIAGNOSIS — Z0001 Encounter for general adult medical examination with abnormal findings: Secondary | ICD-10-CM | POA: Diagnosis not present

## 2023-12-10 DIAGNOSIS — R1084 Generalized abdominal pain: Secondary | ICD-10-CM | POA: Diagnosis not present

## 2023-12-10 DIAGNOSIS — Z1159 Encounter for screening for other viral diseases: Secondary | ICD-10-CM | POA: Diagnosis not present

## 2023-12-10 DIAGNOSIS — F17200 Nicotine dependence, unspecified, uncomplicated: Secondary | ICD-10-CM | POA: Diagnosis not present

## 2023-12-10 DIAGNOSIS — E785 Hyperlipidemia, unspecified: Secondary | ICD-10-CM | POA: Diagnosis not present

## 2023-12-10 DIAGNOSIS — F17210 Nicotine dependence, cigarettes, uncomplicated: Secondary | ICD-10-CM | POA: Diagnosis not present

## 2023-12-25 ENCOUNTER — Encounter: Payer: Self-pay | Admitting: Orthopaedic Surgery

## 2023-12-25 ENCOUNTER — Ambulatory Visit (INDEPENDENT_AMBULATORY_CARE_PROVIDER_SITE_OTHER): Admitting: Orthopaedic Surgery

## 2023-12-25 DIAGNOSIS — M25562 Pain in left knee: Secondary | ICD-10-CM | POA: Diagnosis not present

## 2023-12-25 DIAGNOSIS — G8929 Other chronic pain: Secondary | ICD-10-CM

## 2023-12-25 MED ORDER — METHYLPREDNISOLONE ACETATE 40 MG/ML IJ SUSP
40.0000 mg | Freq: Once | INTRAMUSCULAR | Status: AC
  Administered 2023-12-25: 40 mg via INTRA_ARTICULAR

## 2023-12-25 NOTE — Progress Notes (Signed)
 PROCEDURE NOTE:  The patient request injection, verbal consent was obtained.  The left knee was prepped appropriately after time out was performed.   Sterile technique was observed and anesthesia was provided by ethyl chloride and a 20-gauge needle was used to inject the knee area.  A 16-gauge needle was then used to aspirate the knee.  Color of fluid aspirated was opaque straw  Total cc's aspirated was 100.    Injection of 1 cc of DepoMedrol 40 with several cc's of plain xylocaine was then performed.  A band aid dressing was applied.  The patient was advised to apply ice later today and tomorrow to the injection sight as needed.   Encounter Diagnosis  Name Primary?   Chronic pain of left knee Yes   I will see prn.  Call if any problem.  Precautions discussed.  Electronically Signed Pleasant Brilliant, MD 4/16/20253:05 PM

## 2023-12-25 NOTE — Addendum Note (Signed)
 Addended by: [place] Snow T on: 12/25/2023 03:28 PM   Modules accepted: Orders

## 2024-01-02 ENCOUNTER — Telehealth: Payer: Self-pay | Admitting: Orthopaedic Surgery

## 2024-01-02 MED ORDER — HYDROCODONE-ACETAMINOPHEN 5-325 MG PO TABS: ORAL_TABLET | ORAL | 0 refills | Status: AC

## 2024-01-02 NOTE — Telephone Encounter (Signed)
 Dr. Vicente Graham pt - spoke w/the pt, he stated he has fluid on his knee again, he did schedule an appointment, but is asking for something for pain to be sent to [facility]

## 2024-01-08 ENCOUNTER — Encounter: Payer: Self-pay | Admitting: Orthopaedic Surgery

## 2024-01-08 ENCOUNTER — Ambulatory Visit (INDEPENDENT_AMBULATORY_CARE_PROVIDER_SITE_OTHER): Admitting: Orthopaedic Surgery

## 2024-01-08 DIAGNOSIS — G8929 Other chronic pain: Secondary | ICD-10-CM

## 2024-01-08 DIAGNOSIS — M25562 Pain in left knee: Secondary | ICD-10-CM

## 2024-01-08 MED ORDER — METHYLPREDNISOLONE ACETATE 40 MG/ML IJ SUSP
40.0000 mg | Freq: Once | INTRAMUSCULAR | Status: AC
  Administered 2024-01-08: 40 mg via INTRA_ARTICULAR

## 2024-01-08 NOTE — Progress Notes (Signed)
 PROCEDURE NOTE:  The patient request injection, verbal consent was obtained.  The left knee was prepped appropriately after time out was performed.   Sterile technique was observed and anesthesia was provided by ethyl chloride and a 20-gauge needle was used to inject the knee area.  A 16-gauge needle was then used to aspirate the knee.  Color of fluid aspirated was staw  Total cc's aspirated was 45.    Injection of 1 cc of DepoMedrol 40 with several cc's of plain xylocaine  was then performed.  A band aid dressing was applied.  The patient was advised to apply ice later today and tomorrow to the injection sight as needed.   Encounter Diagnosis  Name Primary?   Chronic pain of left knee Yes   Return prn  Call if any problem.  Precautions discussed.  Electronically Signed Pleasant Brilliant, MD 4/30/20259:19 AM

## 2024-01-08 NOTE — Addendum Note (Signed)
 Addended by: Maryland Snow T on: 01/08/2024 10:37 AM   Modules accepted: Orders

## 2024-02-12 DIAGNOSIS — H5203 Hypermetropia, bilateral: Secondary | ICD-10-CM | POA: Diagnosis not present

## 2024-02-12 DIAGNOSIS — H524 Presbyopia: Secondary | ICD-10-CM | POA: Diagnosis not present

## 2024-02-12 DIAGNOSIS — H52222 Regular astigmatism, left eye: Secondary | ICD-10-CM | POA: Diagnosis not present

## 2024-02-12 DIAGNOSIS — H25813 Combined forms of age-related cataract, bilateral: Secondary | ICD-10-CM | POA: Diagnosis not present

## 2024-03-12 DIAGNOSIS — E785 Hyperlipidemia, unspecified: Secondary | ICD-10-CM | POA: Diagnosis not present

## 2024-03-12 DIAGNOSIS — R7303 Prediabetes: Secondary | ICD-10-CM | POA: Diagnosis not present

## 2024-03-12 DIAGNOSIS — I10 Essential (primary) hypertension: Secondary | ICD-10-CM | POA: Diagnosis not present

## 2024-03-12 DIAGNOSIS — M1A00X0 Idiopathic chronic gout, unspecified site, without tophus (tophi): Secondary | ICD-10-CM | POA: Diagnosis not present

## 2024-04-12 DIAGNOSIS — I10 Essential (primary) hypertension: Secondary | ICD-10-CM | POA: Diagnosis not present

## 2024-04-12 DIAGNOSIS — E785 Hyperlipidemia, unspecified: Secondary | ICD-10-CM | POA: Diagnosis not present

## 2024-05-13 DIAGNOSIS — E785 Hyperlipidemia, unspecified: Secondary | ICD-10-CM | POA: Diagnosis not present

## 2024-05-13 DIAGNOSIS — I10 Essential (primary) hypertension: Secondary | ICD-10-CM | POA: Diagnosis not present
